# Patient Record
Sex: Female | Born: 1960 | Hispanic: No | Marital: Single | Smoking: Former smoker
Health system: Southern US, Community
[De-identification: ages and names within clinical notes are randomized; demographics above are authoritative.]

## PROBLEM LIST (undated history)

## (undated) DIAGNOSIS — Z803 Family history of malignant neoplasm of breast: Secondary | ICD-10-CM

## (undated) HISTORY — DX: Family history of malignant neoplasm of breast: Z80.3

## (undated) HISTORY — PX: BREAST BIOPSY: SHX20

## (undated) MED FILL — Dexamethasone Sodium Phosphate Inj 100 MG/10ML: INTRAMUSCULAR | Qty: 1 | Status: AC

---

## 2008-05-10 ENCOUNTER — Encounter: Payer: Self-pay | Admitting: Pulmonary Disease

## 2008-05-11 ENCOUNTER — Encounter: Payer: Self-pay | Admitting: Pulmonary Disease

## 2008-05-17 DIAGNOSIS — J479 Bronchiectasis, uncomplicated: Secondary | ICD-10-CM | POA: Insufficient documentation

## 2008-05-18 ENCOUNTER — Ambulatory Visit: Payer: Self-pay | Admitting: Pulmonary Disease

## 2008-05-18 DIAGNOSIS — K219 Gastro-esophageal reflux disease without esophagitis: Secondary | ICD-10-CM

## 2008-05-18 DIAGNOSIS — J45909 Unspecified asthma, uncomplicated: Secondary | ICD-10-CM | POA: Insufficient documentation

## 2008-05-18 DIAGNOSIS — Z8679 Personal history of other diseases of the circulatory system: Secondary | ICD-10-CM | POA: Insufficient documentation

## 2008-05-18 DIAGNOSIS — J449 Chronic obstructive pulmonary disease, unspecified: Secondary | ICD-10-CM | POA: Insufficient documentation

## 2008-05-18 DIAGNOSIS — Z8541 Personal history of malignant neoplasm of cervix uteri: Secondary | ICD-10-CM | POA: Insufficient documentation

## 2008-05-18 DIAGNOSIS — R079 Chest pain, unspecified: Secondary | ICD-10-CM

## 2008-05-18 DIAGNOSIS — E785 Hyperlipidemia, unspecified: Secondary | ICD-10-CM

## 2008-05-18 DIAGNOSIS — F17200 Nicotine dependence, unspecified, uncomplicated: Secondary | ICD-10-CM | POA: Insufficient documentation

## 2008-05-18 DIAGNOSIS — J4489 Other specified chronic obstructive pulmonary disease: Secondary | ICD-10-CM | POA: Insufficient documentation

## 2017-10-23 DIAGNOSIS — M5136 Other intervertebral disc degeneration, lumbar region: Secondary | ICD-10-CM | POA: Insufficient documentation

## 2017-10-23 DIAGNOSIS — M5416 Radiculopathy, lumbar region: Secondary | ICD-10-CM | POA: Insufficient documentation

## 2018-06-04 DIAGNOSIS — M50121 Cervical disc disorder at C4-C5 level with radiculopathy: Secondary | ICD-10-CM | POA: Insufficient documentation

## 2020-04-01 LAB — HEPATIC FUNCTION PANEL
ALT: 26 (ref 7–35)
AST: 33 (ref 13–35)
Alkaline Phosphatase: 102 (ref 25–125)
Bilirubin, Total: 0.5

## 2020-04-01 LAB — BASIC METABOLIC PANEL
BUN: 12 (ref 4–21)
CO2: 27 — AB (ref 13–22)
Chloride: 103 (ref 99–108)
Creatinine: 0.7 (ref 0.5–1.1)
Glucose: 152
Potassium: 3.5 (ref 3.4–5.3)
Sodium: 140 (ref 137–147)

## 2020-04-01 LAB — CBC: RBC: 4.46 (ref 3.87–5.11)

## 2020-04-01 LAB — IRON,TIBC AND FERRITIN PANEL
%SAT: 48.4
Ferritin: 579
Iron: 122
TIBC: 252

## 2020-04-01 LAB — CBC AND DIFFERENTIAL
HCT: 44 (ref 36–46)
Hemoglobin: 14.8 (ref 12.0–16.0)
Platelets: 218 (ref 150–399)
WBC: 8

## 2020-04-01 LAB — COMPREHENSIVE METABOLIC PANEL
Albumin: 4.3 (ref 3.5–5.0)
Calcium: 9.2 (ref 8.7–10.7)

## 2020-04-07 ENCOUNTER — Encounter: Payer: Self-pay | Admitting: Pharmacist

## 2020-04-07 ENCOUNTER — Other Ambulatory Visit: Payer: Self-pay | Admitting: Hematology and Oncology

## 2020-04-08 ENCOUNTER — Other Ambulatory Visit: Payer: Self-pay

## 2020-04-08 ENCOUNTER — Inpatient Hospital Stay: Payer: BC Managed Care – PPO | Attending: Oncology

## 2020-04-08 ENCOUNTER — Other Ambulatory Visit: Payer: Self-pay | Admitting: Hematology and Oncology

## 2020-04-08 NOTE — Progress Notes (Unsigned)
Patient in for phlebotomy and 2 attempts made for phlebotomy and both clotted off before blood reached the bag.Patient discharged with appt next Friday 04/15/2020 at 2 pm. Patient stable at discharge.

## 2020-04-15 ENCOUNTER — Inpatient Hospital Stay: Payer: BC Managed Care – PPO

## 2020-04-15 NOTE — Patient Instructions (Signed)

## 2020-04-15 NOTE — Progress Notes (Signed)
Kathleen Sherman presents today for phlebotomy per MD orders. Phlebotomy procedure started at 1420 and ended at 1431 502 grams removed. Patient observed for 20 minutes after procedure without any incident. Patient tolerated procedure well. IV needle removed intact.

## 2020-05-16 ENCOUNTER — Other Ambulatory Visit: Payer: Self-pay

## 2020-05-16 ENCOUNTER — Inpatient Hospital Stay: Payer: BC Managed Care – PPO | Attending: Oncology

## 2020-05-16 NOTE — Patient Instructions (Signed)

## 2020-05-16 NOTE — Progress Notes (Signed)
Kathleen Sherman presents today for phlebotomy per MD orders. Phlebotomy procedure started [id number] and ended at 1430. 485  grams removed. Patient observed for 30 minutes after procedure without any incident. Patient tolerated procedure well. IV needle removed intact.

## 2020-06-10 ENCOUNTER — Other Ambulatory Visit: Payer: Self-pay | Admitting: Hematology and Oncology

## 2020-06-10 NOTE — Progress Notes (Signed)
PT STABLE AT TIME OF DISCHARGE 

## 2020-06-14 NOTE — Progress Notes (Signed)
Called to speak with patient, she is listed as self-pay in epic. She states she now has [employer] Medicare. I let Damaris in [place] and Butch Penny in [place] now about change.

## 2020-06-15 ENCOUNTER — Inpatient Hospital Stay: Payer: Medicare Other | Attending: Oncology

## 2020-06-15 ENCOUNTER — Other Ambulatory Visit: Payer: Self-pay

## 2020-06-15 NOTE — Patient Instructions (Signed)

## 2020-06-15 NOTE — Progress Notes (Signed)
PT STABLE AT TIME OF DISCHARGE 

## 2020-06-15 NOTE — Progress Notes (Signed)
Kathleen Sherman presents today for phlebotomy per MD orders. Phlebotomy procedure started at 1415 and ended at 1425. 491 grams removed. Patient observed for 30 minutes after procedure without any incident. Patient tolerated procedure well. IV needle removed intact.

## 2020-06-16 DIAGNOSIS — R928 Other abnormal and inconclusive findings on diagnostic imaging of breast: Secondary | ICD-10-CM | POA: Diagnosis not present

## 2020-06-16 DIAGNOSIS — N6489 Other specified disorders of breast: Secondary | ICD-10-CM | POA: Diagnosis not present

## 2020-07-03 ENCOUNTER — Other Ambulatory Visit: Payer: Self-pay | Admitting: Oncology

## 2020-07-03 NOTE — Progress Notes (Signed)
[facility]  [address] [place],  [place]  [address] [phone]  Clinic Day:  07/04/2020  Referring physician: Ernestene Kiel, MD   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  Although she is only a carrier, she has the most aggressive strain of hemochromatosis mutation, which was likely behind her elevated iron parameters at her initial visit.  This led to her being phlebotomized over these past few months.  She comes in today to reassess her iron parameters.  Since her last visit, the patient has been doing well.  She denies having any significant myalgias/arthralgias which concern her for complications related to her underlying hemochromatosis.    PHYSICAL EXAM:  Blood pressure (!) 156/88, pulse 92, temperature 97.8 F (36.6 C), resp. rate 16, height 5\' 2"  (1.575 m), weight 206 lb 1.6 oz (93.5 kg), SpO2 95 %. Wt Readings from Last 3 Encounters:  07/04/20 206 lb 1.6 oz (93.5 kg)  04/01/20 206 lb 8 oz (93.7 kg)  04/18/20 206 lb 8 oz (93.7 kg)   Body mass index is 37.7 kg/m. Performance status (ECOG): 0 Physical Exam Constitutional:      Appearance: Normal appearance. She is not ill-appearing.  HENT:     Mouth/Throat:     Mouth: Mucous membranes are moist.     Pharynx: Oropharynx is clear. No oropharyngeal exudate or posterior oropharyngeal erythema.  Cardiovascular:     Rate and Rhythm: Normal rate and regular rhythm.     Heart sounds: No murmur heard. No friction rub. No gallop.   Pulmonary:     Effort: Pulmonary effort is normal. No respiratory distress.     Breath sounds: Normal breath sounds. No wheezing, rhonchi or rales.  Chest:  Breasts:     Right: No axillary adenopathy or supraclavicular adenopathy.     Left: No axillary adenopathy or supraclavicular adenopathy.    Abdominal:     General: Bowel sounds are normal. There is no distension.     Palpations: Abdomen  is soft. There is no mass.     Tenderness: There is no abdominal tenderness.  Musculoskeletal:        General: No swelling.     Right lower leg: No edema.     Left lower leg: No edema.  Lymphadenopathy:     Cervical: No cervical adenopathy.     Upper Body:     Right upper body: No supraclavicular or axillary adenopathy.     Left upper body: No supraclavicular or axillary adenopathy.     Lower Body: No right inguinal adenopathy. No left inguinal adenopathy.  Skin:    General: Skin is warm.     Coloration: Skin is not jaundiced.     Findings: No lesion or rash.  Neurological:     General: No focal deficit present.     Mental Status: She is alert and oriented to person, place, and time. Mental status is at baseline.     Cranial Nerves: Cranial nerves are intact.  Psychiatric:        Mood and Affect: Mood normal.        Behavior: Behavior normal.        Thought Content: Thought content normal.     LABS:     ASSESSMENT & PLAN:  Assessment/Plan:  A 60 y.o. 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  Her iron parameters are lower today than previously, which reflects how effective her monthly phlebotomies  were in bringing down getting her baseline iron stores.  Her ferritin remains elevated.  Based upon this, I will arrange for her to be phlebotomized within the forthcoming week.  However, as she is only a carrier for hemochromatosis, no additional phlebotomies will be scheduled for now.   I will see her back in 6 months for repeat clinical assessment. The patient understands all the plans discussed today and is in agreement with them.    Onaje Warne Kirby Funk, MD

## 2020-07-04 ENCOUNTER — Other Ambulatory Visit: Payer: Self-pay | Admitting: Oncology

## 2020-07-04 ENCOUNTER — Telehealth: Payer: Self-pay | Admitting: Oncology

## 2020-07-04 ENCOUNTER — Inpatient Hospital Stay: Payer: Medicare Other

## 2020-07-04 ENCOUNTER — Encounter: Payer: Self-pay | Admitting: Oncology

## 2020-07-04 ENCOUNTER — Other Ambulatory Visit: Payer: Self-pay | Admitting: Hematology and Oncology

## 2020-07-04 ENCOUNTER — Inpatient Hospital Stay: Payer: Medicare Other | Attending: Oncology | Admitting: Oncology

## 2020-07-04 ENCOUNTER — Other Ambulatory Visit: Payer: Self-pay

## 2020-07-04 ENCOUNTER — Telehealth: Payer: Self-pay

## 2020-07-04 DIAGNOSIS — Z0001 Encounter for general adult medical examination with abnormal findings: Secondary | ICD-10-CM | POA: Diagnosis not present

## 2020-07-04 DIAGNOSIS — D649 Anemia, unspecified: Secondary | ICD-10-CM | POA: Diagnosis not present

## 2020-07-04 LAB — HEPATIC FUNCTION PANEL
ALT: 24 (ref 7–35)
AST: 25 (ref 13–35)
Alkaline Phosphatase: 98 (ref 25–125)
Bilirubin, Total: 0.4

## 2020-07-04 LAB — BASIC METABOLIC PANEL
BUN: 10 (ref 4–21)
CO2: 29 — AB (ref 13–22)
Chloride: 102 (ref 99–108)
Creatinine: 0.8 (ref 0.5–1.1)
Glucose: 99
Potassium: 3.9 (ref 3.4–5.3)
Sodium: 138 (ref 137–147)

## 2020-07-04 LAB — IRON,TIBC AND FERRITIN PANEL
%SAT: 27
Ferritin: 314
Iron: 71
TIBC: 262

## 2020-07-04 LAB — COMPREHENSIVE METABOLIC PANEL
Albumin: 4.1 (ref 3.5–5.0)
Calcium: 9.4 (ref 8.7–10.7)

## 2020-07-04 LAB — CBC AND DIFFERENTIAL
HCT: 41 (ref 36–46)
Hemoglobin: 14.1 (ref 12.0–16.0)
Neutrophils Absolute: 4.06
Platelets: 218 (ref 150–399)
WBC: 7

## 2020-07-04 LAB — CBC: RBC: 4.19 (ref 3.87–5.11)

## 2020-07-04 NOTE — Telephone Encounter (Signed)
Dr Melvyn Neth recommends pt to have 1 phlebotomy, as ferritin is still 314. He will see her in 6 months. I told pt that schedulers will be calling her with appt. Pt verbalized understanding.

## 2020-07-04 NOTE — Telephone Encounter (Signed)
Per 1/3 LOS, patient scheduled for July Appt's.  Gave patient Appt Summary

## 2020-07-05 ENCOUNTER — Other Ambulatory Visit: Payer: Self-pay | Admitting: Oncology

## 2020-07-06 ENCOUNTER — Telehealth: Payer: Self-pay | Admitting: Oncology

## 2020-07-06 NOTE — Telephone Encounter (Signed)
07/06/20 spoke with patient about next appt

## 2020-07-07 ENCOUNTER — Inpatient Hospital Stay: Payer: Medicare Other

## 2020-07-07 ENCOUNTER — Other Ambulatory Visit: Payer: Self-pay

## 2020-07-07 NOTE — Progress Notes (Signed)
Kathleen Sherman presents today for phlebotomy per MD orders. Phlebotomy procedure started at 1533 and ended at 1544, 491 grams removed. Patient observed for 30 minutes after procedure without any incident. Patient tolerated procedure well. IV needle removed intact.

## 2020-07-07 NOTE — Patient Instructions (Signed)
Therapeutic Phlebotomy Therapeutic phlebotomy is the planned removal of blood from a person's body for the purpose of treating a medical condition. The procedure is similar to donating blood. Usually, about a pint (470 mL, or 0.47 L) of blood is removed. The average adult has 9-12 pints (4.3-5.7 L) of blood in the body. Therapeutic phlebotomy may be used to treat the following medical conditions:  Hemochromatosis. This is a condition in which the blood contains too much iron.  Polycythemia vera. This is a condition in which the blood contains too many red blood cells.  Porphyria cutanea tarda. This is a disease in which an important part of hemoglobin is not made properly. It results in the buildup of abnormal amounts of porphyrins in the body.  Sickle cell disease. This is a condition in which the red blood cells form an abnormal crescent shape rather than a round shape. Tell a health care provider about:  Any allergies you have.  All medicines you are taking, including vitamins, herbs, eye drops, creams, and over-the-counter medicines.  Any problems you or family members have had with anesthetic medicines.  Any blood disorders you have.  Any surgeries you have had.  Any medical conditions you have.  Whether you are pregnant or may be pregnant. What are the risks? Generally, this is a safe procedure. However, problems may occur, including:  Nausea or light-headedness.  Low blood pressure (hypotension).  Soreness, bleeding, swelling, or bruising at the needle insertion site.  Infection. What happens before the procedure?  Follow instructions from your health care provider about eating or drinking restrictions.  Ask your health care provider about: ? Changing or stopping your regular medicines. This is especially important if you are taking diabetes medicines or blood thinners (anticoagulants). ? Taking medicines such as aspirin and ibuprofen. These medicines can thin your  blood. Do not take these medicines unless your health care provider tells you to take them. ? Taking over-the-counter medicines, vitamins, herbs, and supplements.  Wear clothing with sleeves that can be raised above the elbow.  Plan to have someone take you home from the hospital or clinic.  You may have a blood sample taken.  Your blood pressure, pulse rate, and breathing rate will be measured. What happens during the procedure?   To lower your risk of infection: ? Your health care team will wash or sanitize their hands. ? Your skin will be cleaned with an antiseptic.  You may be given a medicine to numb the area (local anesthetic).  A tourniquet will be placed on your arm.  A needle will be inserted into one of your veins.  Tubing and a collection bag will be attached to that needle.  Blood will flow through the needle and tubing into the collection bag.  The collection bag will be placed lower than your arm to allow gravity to help the flow of blood into the bag.  You may be asked to open and close your hand slowly and continually during the entire collection.  After the specified amount of blood has been removed from your body, the collection bag and tubing will be clamped.  The needle will be removed from your vein.  Pressure will be held on the site of the needle insertion to stop the bleeding.  A bandage (dressing) will be placed over the needle insertion site. The procedure may vary among health care providers and hospitals. What happens after the procedure?  Your blood pressure, pulse rate, and breathing rate will be   measured after the procedure.  You will be encouraged to drink fluids.  Your recovery will be assessed and monitored.  You can return to your normal activities as told by your health care provider. Summary  Therapeutic phlebotomy is the planned removal of blood from a person's body for the purpose of treating a medical condition.  Therapeutic  phlebotomy may be used to treat hemochromatosis, polycythemia vera, porphyria cutanea tarda, or sickle cell disease.  In the procedure, a needle is inserted and about a pint (470 mL, or 0.47 L) of blood is removed. The average adult has 9-12 pints (4.3-5.7 L) of blood in the body.  This is generally a safe procedure, but it can sometimes cause problems such as nausea, light-headedness, or low blood pressure (hypotension). This information is not intended to replace advice given to you by your health care provider. Make sure you discuss any questions you have with your health care provider. Document Revised: 07/04/2017 Document Reviewed: 07/04/2017 Elsevier Patient Education  2020 Elsevier Inc. Therapeutic Phlebotomy Discharge Instructions  - Increase your fluid intake over the next 4 hours  - No smoking for 30 minutes  - Avoid using the affected arm (the one you had the blood drawn from) for heavy lifting or other activities.  - You may resume all normal activities after 30 minutes.  You are to notify the office if you experience:   - Persistent dizziness and/or lightheadedness -Uncontrolled or excessive bleeding at the site.

## 2020-09-02 DIAGNOSIS — M65332 Trigger finger, left middle finger: Secondary | ICD-10-CM | POA: Diagnosis not present

## 2020-09-02 DIAGNOSIS — G5602 Carpal tunnel syndrome, left upper limb: Secondary | ICD-10-CM | POA: Diagnosis not present

## 2020-09-16 DIAGNOSIS — J300 Vasomotor rhinitis: Secondary | ICD-10-CM | POA: Diagnosis not present

## 2020-09-16 DIAGNOSIS — R0602 Shortness of breath: Secondary | ICD-10-CM | POA: Diagnosis not present

## 2020-09-16 DIAGNOSIS — Z0000 Encounter for general adult medical examination without abnormal findings: Secondary | ICD-10-CM | POA: Diagnosis not present

## 2020-09-16 DIAGNOSIS — E785 Hyperlipidemia, unspecified: Secondary | ICD-10-CM | POA: Diagnosis not present

## 2020-09-16 DIAGNOSIS — Z1211 Encounter for screening for malignant neoplasm of colon: Secondary | ICD-10-CM | POA: Diagnosis not present

## 2020-09-16 DIAGNOSIS — R079 Chest pain, unspecified: Secondary | ICD-10-CM | POA: Diagnosis not present

## 2020-11-04 DIAGNOSIS — J300 Vasomotor rhinitis: Secondary | ICD-10-CM | POA: Diagnosis not present

## 2020-11-04 DIAGNOSIS — E785 Hyperlipidemia, unspecified: Secondary | ICD-10-CM | POA: Diagnosis not present

## 2020-11-04 DIAGNOSIS — G5603 Carpal tunnel syndrome, bilateral upper limbs: Secondary | ICD-10-CM | POA: Diagnosis not present

## 2020-11-11 DIAGNOSIS — Z4789 Encounter for other orthopedic aftercare: Secondary | ICD-10-CM | POA: Diagnosis not present

## 2020-11-11 DIAGNOSIS — M542 Cervicalgia: Secondary | ICD-10-CM | POA: Diagnosis not present

## 2020-12-05 DIAGNOSIS — Z4789 Encounter for other orthopedic aftercare: Secondary | ICD-10-CM | POA: Diagnosis not present

## 2020-12-05 DIAGNOSIS — M25512 Pain in left shoulder: Secondary | ICD-10-CM | POA: Diagnosis not present

## 2020-12-05 DIAGNOSIS — M542 Cervicalgia: Secondary | ICD-10-CM | POA: Diagnosis not present

## 2020-12-16 DIAGNOSIS — Z4789 Encounter for other orthopedic aftercare: Secondary | ICD-10-CM | POA: Diagnosis not present

## 2020-12-16 DIAGNOSIS — R7309 Other abnormal glucose: Secondary | ICD-10-CM | POA: Diagnosis not present

## 2020-12-16 DIAGNOSIS — E785 Hyperlipidemia, unspecified: Secondary | ICD-10-CM | POA: Diagnosis not present

## 2020-12-16 DIAGNOSIS — M25512 Pain in left shoulder: Secondary | ICD-10-CM | POA: Diagnosis not present

## 2020-12-16 DIAGNOSIS — M542 Cervicalgia: Secondary | ICD-10-CM | POA: Diagnosis not present

## 2020-12-16 DIAGNOSIS — Z79899 Other long term (current) drug therapy: Secondary | ICD-10-CM | POA: Diagnosis not present

## 2020-12-27 NOTE — Progress Notes (Signed)
[facility]  [address] [place],  [place]  [address] [phone]  Clinic Day:  01/03/2021  Referring physician: Ernestene Kiel, MD  This document serves as a record of services personally performed by Marice Potter, MD. It was created on their behalf by [facility], a trained medical scribe. The creation of this record is based on the scribe's personal observations and the provider's statements to them.  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  Although she is only a carrier, she has the most aggressive strain of hemochromatosis mutation, which led to her last phlebotomy being done 6 months ago.  She comes in today to reassess her iron parameters.  Since her last visit, the patient has been doing well.  She denies having any significant myalgias/arthralgias which concern her for complications related to her underlying hemochromatosis.    PHYSICAL EXAM:  Blood pressure (!) 145/85, pulse (!) 102, temperature 98.3 F (36.8 C), resp. rate 16, height 5\' 2"  (1.575 m), weight 203 lb 8 oz (92.3 kg), SpO2 95 %. Wt Readings from Last 3 Encounters:  01/03/21 203 lb 8 oz (92.3 kg)  07/07/20 208 lb (94.3 kg)  07/04/20 206 lb 1.6 oz (93.5 kg)   Body mass index is 37.22 kg/m. Performance status (ECOG): 0 Physical Exam Constitutional:      Appearance: Normal appearance. She is not ill-appearing.  HENT:     Mouth/Throat:     Mouth: Mucous membranes are moist.     Pharynx: Oropharynx is clear. No oropharyngeal exudate or posterior oropharyngeal erythema.  Cardiovascular:     Rate and Rhythm: Normal rate and regular rhythm.     Heart sounds: No murmur heard.   No friction rub. No gallop.  Pulmonary:     Effort: Pulmonary effort is normal. No respiratory distress.     Breath sounds: Normal breath sounds. No wheezing, rhonchi or rales.  Chest:  Breasts:    Right: No axillary adenopathy  or supraclavicular adenopathy.     Left: No axillary adenopathy or supraclavicular adenopathy.  Abdominal:     General: Bowel sounds are normal. There is no distension.     Palpations: Abdomen is soft. There is no mass.     Tenderness: There is no abdominal tenderness.  Musculoskeletal:        General: No swelling.     Right lower leg: No edema.     Left lower leg: No edema.  Lymphadenopathy:     Cervical: No cervical adenopathy.     Upper Body:     Right upper body: No supraclavicular or axillary adenopathy.     Left upper body: No supraclavicular or axillary adenopathy.     Lower Body: No right inguinal adenopathy. No left inguinal adenopathy.  Skin:    General: Skin is warm.     Coloration: Skin is not jaundiced.     Findings: No lesion or rash.  Neurological:     General: No focal deficit present.     Mental Status: She is alert and oriented to person, place, and time. Mental status is at baseline.     Cranial Nerves: Cranial nerves are intact.  Psychiatric:        Mood and Affect: Mood normal.        Behavior: Behavior normal.        Thought Content: Thought content normal.    LABS:    Ref. Range 12/30/2020  14:50  Iron Latest Ref Range: 28 - 170 ug/dL 99  UIBC Latest Units: ug/dL 201  TIBC Latest Ref Range: 250 - 450 ug/dL 300  Saturation Ratios Latest Ref Range: 10.4 - 31.8 % 33 (H)  Ferritin Latest Ref Range: 11 - 307 ng/mL 249    Ref. Range 12/30/2020 00:00  Sodium Latest Ref Range: 137 - 147  137  Potassium Latest Ref Range: 3.4 - 5.3  3.9  Chloride Latest Ref Range: 99 - 108  99  CO2 Latest Ref Range: 13 - 22  31 (A)  Glucose Unknown 103  BUN Latest Ref Range: 4 - 21  7  Creatinine Latest Ref Range: 0.5 - 1.1  0.6  Calcium Latest Ref Range: 8.7 - 10.7  9.1  Alkaline Phosphatase Latest Ref Range: 25 - 125  101  Albumin Latest Ref Range: 3.5 - 5.0  4.3  AST Latest Ref Range: 13 - 35  27  ALT Latest Ref Range: 7 - 35  21  Bilirubin, Total Unknown 0.8  WBC  Unknown 6.5  RBC Latest Ref Range: 3.87 - 5.11  4.6  Hemoglobin Latest Ref Range: 12.0 - 16.0  14.7  HCT Latest Ref Range: 36 - 46  44  Platelets Latest Ref Range: 150 - 399  212  NEUT# Unknown 4.03   ASSESSMENT & PLAN:  Assessment/Plan:  A 60 y.o. 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  I am pleased as her ferritin has fallen back to a normal level.  As she is only a carrier for hemochromatosis, no additional phlebotomies will be scheduled for now.   I will see her back in 6 months to reassess her iron parameters.  The patient understands all the plans discussed today and is in agreement with them.     I, Rita Ohara, am acting as scribe for Marice Potter, MD    I have reviewed this report as typed by the medical scribe, and it is complete and accurate.  Dequincy Macarthur Critchley, MD

## 2020-12-30 ENCOUNTER — Other Ambulatory Visit: Payer: Self-pay

## 2020-12-30 ENCOUNTER — Encounter: Payer: Self-pay | Admitting: Hematology and Oncology

## 2020-12-30 ENCOUNTER — Inpatient Hospital Stay: Payer: Medicare Other | Attending: Oncology

## 2020-12-30 DIAGNOSIS — D649 Anemia, unspecified: Secondary | ICD-10-CM | POA: Diagnosis not present

## 2020-12-30 LAB — CBC AND DIFFERENTIAL
HCT: 44 (ref 36–46)
Hemoglobin: 14.7 (ref 12.0–16.0)
Neutrophils Absolute: 4.03
Platelets: 212 (ref 150–399)
WBC: 6.5

## 2020-12-30 LAB — BASIC METABOLIC PANEL
BUN: 7 (ref 4–21)
CO2: 31 — AB (ref 13–22)
Chloride: 99 (ref 99–108)
Creatinine: 0.6 (ref 0.5–1.1)
Glucose: 103
Potassium: 3.9 (ref 3.4–5.3)
Sodium: 137 (ref 137–147)

## 2020-12-30 LAB — IRON AND TIBC
Iron: 99 ug/dL (ref 28–170)
Saturation Ratios: 33 % — ABNORMAL HIGH (ref 10.4–31.8)
TIBC: 300 ug/dL (ref 250–450)
UIBC: 201 ug/dL

## 2020-12-30 LAB — COMPREHENSIVE METABOLIC PANEL
Albumin: 4.3 (ref 3.5–5.0)
Calcium: 9.1 (ref 8.7–10.7)

## 2020-12-30 LAB — HEPATIC FUNCTION PANEL
ALT: 21 (ref 7–35)
AST: 27 (ref 13–35)
Alkaline Phosphatase: 101 (ref 25–125)
Bilirubin, Total: 0.8

## 2020-12-30 LAB — CBC: RBC: 4.6 (ref 3.87–5.11)

## 2020-12-30 LAB — FERRITIN: Ferritin: 249 ng/mL (ref 11–307)

## 2021-01-03 ENCOUNTER — Inpatient Hospital Stay (INDEPENDENT_AMBULATORY_CARE_PROVIDER_SITE_OTHER): Payer: Medicare Other | Admitting: Oncology

## 2021-01-03 ENCOUNTER — Other Ambulatory Visit: Payer: Self-pay | Admitting: Oncology

## 2021-01-03 ENCOUNTER — Other Ambulatory Visit: Payer: Self-pay

## 2021-01-27 DIAGNOSIS — K5730 Diverticulosis of large intestine without perforation or abscess without bleeding: Secondary | ICD-10-CM | POA: Diagnosis not present

## 2021-01-27 DIAGNOSIS — Z1211 Encounter for screening for malignant neoplasm of colon: Secondary | ICD-10-CM | POA: Diagnosis not present

## 2021-01-27 DIAGNOSIS — D123 Benign neoplasm of transverse colon: Secondary | ICD-10-CM | POA: Diagnosis not present

## 2021-01-27 DIAGNOSIS — K635 Polyp of colon: Secondary | ICD-10-CM | POA: Diagnosis not present

## 2021-02-01 DIAGNOSIS — R922 Inconclusive mammogram: Secondary | ICD-10-CM | POA: Diagnosis not present

## 2021-02-01 DIAGNOSIS — R928 Other abnormal and inconclusive findings on diagnostic imaging of breast: Secondary | ICD-10-CM | POA: Diagnosis not present

## 2021-02-01 DIAGNOSIS — N6311 Unspecified lump in the right breast, upper outer quadrant: Secondary | ICD-10-CM | POA: Diagnosis not present

## 2021-02-06 DIAGNOSIS — C50511 Malignant neoplasm of lower-outer quadrant of right female breast: Secondary | ICD-10-CM | POA: Diagnosis not present

## 2021-02-06 DIAGNOSIS — C50411 Malignant neoplasm of upper-outer quadrant of right female breast: Secondary | ICD-10-CM | POA: Diagnosis not present

## 2021-02-15 DIAGNOSIS — C50411 Malignant neoplasm of upper-outer quadrant of right female breast: Secondary | ICD-10-CM | POA: Diagnosis not present

## 2021-02-15 DIAGNOSIS — Z170 Estrogen receptor positive status [ER+]: Secondary | ICD-10-CM | POA: Diagnosis not present

## 2021-02-16 ENCOUNTER — Telehealth: Payer: Self-pay | Admitting: Genetic Counselor

## 2021-02-16 ENCOUNTER — Other Ambulatory Visit: Payer: Self-pay | Admitting: Surgery

## 2021-02-16 DIAGNOSIS — C50411 Malignant neoplasm of upper-outer quadrant of right female breast: Secondary | ICD-10-CM

## 2021-02-16 DIAGNOSIS — Z170 Estrogen receptor positive status [ER+]: Secondary | ICD-10-CM

## 2021-02-16 NOTE — Telephone Encounter (Signed)
Received a call from [place]. [place] to schedule a genetic counseling appt w/Karen on 8/31 at 2pm. Pt aware to arrive 15 minutes early.

## 2021-03-01 ENCOUNTER — Inpatient Hospital Stay: Payer: Medicare Other | Attending: Oncology | Admitting: Genetic Counselor

## 2021-03-01 ENCOUNTER — Encounter: Payer: Self-pay | Admitting: Genetic Counselor

## 2021-03-01 ENCOUNTER — Ambulatory Visit
Admission: RE | Admit: 2021-03-01 | Discharge: 2021-03-01 | Disposition: A | Discharge status: Home / Self Care | Payer: Medicare Other | Source: Ambulatory Visit | Attending: Surgery | Admitting: Surgery

## 2021-03-01 ENCOUNTER — Inpatient Hospital Stay: Payer: Medicare Other

## 2021-03-01 ENCOUNTER — Other Ambulatory Visit: Payer: Self-pay

## 2021-03-01 DIAGNOSIS — Z803 Family history of malignant neoplasm of breast: Secondary | ICD-10-CM | POA: Diagnosis not present

## 2021-03-01 DIAGNOSIS — C50411 Malignant neoplasm of upper-outer quadrant of right female breast: Secondary | ICD-10-CM | POA: Diagnosis not present

## 2021-03-01 DIAGNOSIS — Z170 Estrogen receptor positive status [ER+]: Secondary | ICD-10-CM | POA: Diagnosis not present

## 2021-03-01 DIAGNOSIS — D0511 Intraductal carcinoma in situ of right breast: Secondary | ICD-10-CM | POA: Diagnosis not present

## 2021-03-01 MED ORDER — GADOBUTROL 1 MMOL/ML IV SOLN
10.0000 mL | Freq: Once | INTRAVENOUS | Status: AC | PRN
  Administered 2021-03-01: 10 mL via INTRAVENOUS

## 2021-03-03 ENCOUNTER — Encounter: Payer: Self-pay | Admitting: Genetic Counselor

## 2021-03-03 ENCOUNTER — Encounter: Payer: Self-pay | Admitting: Oncology

## 2021-03-03 DIAGNOSIS — C50411 Malignant neoplasm of upper-outer quadrant of right female breast: Secondary | ICD-10-CM | POA: Insufficient documentation

## 2021-03-03 DIAGNOSIS — Z803 Family history of malignant neoplasm of breast: Secondary | ICD-10-CM | POA: Insufficient documentation

## 2021-03-03 NOTE — Progress Notes (Signed)
REFERRING PROVIDER: Pollyann Samples, MD [place]. Kathleen Sherman,  [place] [address]  PRIMARY PROVIDER:  Ernestene Kiel, MD  PRIMARY REASON FOR VISIT:  1. Family history of breast cancer   2. Malignant neoplasm of upper-outer quadrant of right breast in female, estrogen receptor positive ([place])      HISTORY OF PRESENT ILLNESS:   Kathleen Sherman, a 60 y.o. female, was seen for a  cancer genetics consultation at the request of Dr. Lilia Pro due to a personal and family history of breast cancer.  Kathleen Sherman presents to clinic today to discuss the possibility of a hereditary predisposition to cancer, genetic testing, and to further clarify her future cancer risks, as well as potential cancer risks for family members.   In August 2022, at the age of 8, Kathleen Sherman was diagnosed with cancer of the right breast.   CANCER HISTORY:  Oncology History   No history exists.     RISK FACTORS:  Menarche was at age 62.  First live birth at age 38.   Ovaries intact: Yes. Hysterectomy: no.  Menopausal status: postmenopausal.  HRT use: 0 years. Colonoscopy: yes;  2-5 polyps . Mammogram within the last year: yes. Number of breast biopsies: 1. Up to date with pelvic exams: yes. Any excessive radiation exposure in the past: no  Past Medical History:  Diagnosis Date   Family history of breast cancer     Social History   Socioeconomic History   Marital status: Single    Spouse name: Not on file   Number of children: Not on file   Years of education: Not on file   Highest education level: Not on file  Occupational History   Not on file  Tobacco Use   Smoking status: Former   Smokeless tobacco: Never  Substance and Sexual Activity   Alcohol use: Not on file   Drug use: Not on file   Sexual activity: Not on file  Other Topics Concern   Not on file  Social History Narrative   Not on file   Social Determinants of Health   Financial Resource Strain: Not on file  Food  Insecurity: Not on file  Transportation Needs: Not on file  Physical Activity: Not on file  Stress: Not on file  Social Connections: Not on file     FAMILY HISTORY:  We obtained a detailed, 4-generation family history.  Significant diagnoses are listed below: Family History  Problem Relation Age of Onset   Breast cancer Mother 56   Cirrhosis Father    Breast cancer Sister 79   Lung cancer Maternal Aunt    Stomach cancer Paternal Uncle    Stomach cancer Paternal Uncle    Leukemia Maternal Grandmother     The patient has one son who is cancer free.  She has two sisters and five brothers.  One sister had breast cancer at 37.   The patient's mother had breast cancer at 40.  She has several siblings, two sisters had lung cancer.  Her mother had leukemia.  The patient's father died of liver cancer.  He had five brothers and a sister.  Two brothers had stomach cancer.  Kathleen Sherman is unaware of previous family history of genetic testing for hereditary cancer risks. Patient's maternal ancestors are of [place] descent, and paternal ancestors are of [place] descent. There is no reported Ashkenazi Jewish ancestry. There is no known consanguinity.  GENETIC COUNSELING ASSESSMENT: Kathleen Sherman is a 60 y.o. female with  a personal and family history of breast cancer which is somewhat suggestive of a hereditary cancer syndrome and predisposition to cancer given the young age of onset in her sister and three people with breast cancer. We, therefore, discussed and recommended the following at today's visit.   DISCUSSION: We discussed that 5 - 10% of breast cancer is hereditary, with most cases associated with BRCA mutations.  There are other genes that can be associated with hereditary breast cancer syndromes.  These include ATM, CHEK2 and PALB2.  We discussed that testing is beneficial for several reasons including knowing how to follow individuals after completing their treatment, and understand if other  family members could be at risk for cancer and allow them to undergo genetic testing.   We reviewed the characteristics, features and inheritance patterns of hereditary cancer syndromes. We also discussed genetic testing, including the appropriate family members to test, the process of testing, insurance coverage and turn-around-time for results. We discussed the implications of a negative, positive, carrier and/or variant of uncertain significant result. We recommended Kathleen Sherman pursue genetic testing for the BRCAPlus gene panel. Once completed, we will reflex to a CancerNext-Expanded+RNAinsight panel.  The CancerNext-Expanded gene panel offered by [facility] and includes sequencing and rearrangement analysis for the following 77 genes: AIP, ALK, APC*, ATM*, AXIN2, BAP1, BARD1, BLM, BMPR1A, BRCA1*, BRCA2*, BRIP1*, CDC73, CDH1*, CDK4, CDKN1B, CDKN2A, CHEK2*, CTNNA1, DICER1, FANCC, FH, FLCN, GALNT12, KIF1B, LZTR1, MAX, MEN1, MET, MLH1*, MSH2*, MSH3, MSH6*, MUTYH*, NBN, NF1*, NF2, NTHL1, PALB2*, PHOX2B, PMS2*, POT1, PRKAR1A, PTCH1, PTEN*, RAD51C*, RAD51D*, RB1, RECQL, RET, SDHA, SDHAF2, SDHB, SDHC, SDHD, SMAD4, SMARCA4, SMARCB1, SMARCE1, STK11, SUFU, TMEM127, TP53*, TSC1, TSC2, VHL and XRCC2 (sequencing and deletion/duplication); EGFR, EGLN1, HOXB13, KIT, MITF, PDGFRA, POLD1, and POLE (sequencing only); EPCAM and GREM1 (deletion/duplication only). DNA and RNA analyses performed for * genes.   Based on Kathleen Sherman's personal and family history of cancer, she meets medical criteria for genetic testing. Despite that she meets criteria, she may still have an out of pocket cost. We discussed that if her out of pocket cost for testing is over $100, the laboratory will call and confirm whether she wants to proceed with testing.  If the out of pocket cost of testing is less than $100 she will be billed by the genetic testing laboratory.   PLAN: After considering the risks, benefits, and limitations, Kathleen Sherman  provided informed consent to pursue genetic testing and the blood sample was sent to [facility] for analysis of the CancerNext-Expanded_RNAinsight. Results should be available within approximately 2-3 weeks' time, at which point they will be disclosed by telephone to Kathleen Sherman, as will any additional recommendations warranted by these results. Kathleen Sherman will receive a summary of her genetic counseling visit and a copy of her results once available. This information will also be available in Epic.   Lastly, we encouraged Kathleen Sherman to remain in contact with cancer genetics annually so that we can continuously update the family history and inform her of any changes in cancer genetics and testing that may be of benefit for this family.   Kathleen Sherman questions were answered to her satisfaction today. Our contact information was provided should additional questions or concerns arise. Thank you for the referral and allowing [place] to share in the care of your patient.   Dariya Sherman P. Florene Sherman, [place], [facility] Licensed, Insurance risk surveyor [email or web address] phone: [phone]  The patient was seen for a total of 45 minutes in face-to-face genetic counseling.  The patient was seen alone.   This patient was discussed with Drs. Magrinat, Lindi Adie and/or Burr Medico who agrees with the above.    _______________________________________________________________________ For Office Staff:  Number of people involved in session: 1 Was an Intern/ student involved with case: yes Kathleen Sherman

## 2021-03-09 ENCOUNTER — Other Ambulatory Visit: Payer: Self-pay | Admitting: Surgery

## 2021-03-09 DIAGNOSIS — Z170 Estrogen receptor positive status [ER+]: Secondary | ICD-10-CM

## 2021-03-09 DIAGNOSIS — C50411 Malignant neoplasm of upper-outer quadrant of right female breast: Secondary | ICD-10-CM

## 2021-03-10 ENCOUNTER — Other Ambulatory Visit: Payer: Self-pay | Admitting: Surgery

## 2021-03-10 DIAGNOSIS — Z170 Estrogen receptor positive status [ER+]: Secondary | ICD-10-CM

## 2021-03-10 DIAGNOSIS — C50411 Malignant neoplasm of upper-outer quadrant of right female breast: Secondary | ICD-10-CM

## 2021-03-14 ENCOUNTER — Other Ambulatory Visit: Payer: Self-pay | Admitting: Genetic Counselor

## 2021-03-14 ENCOUNTER — Telehealth: Payer: Self-pay | Admitting: Genetic Counselor

## 2021-03-14 DIAGNOSIS — Z170 Estrogen receptor positive status [ER+]: Secondary | ICD-10-CM

## 2021-03-14 NOTE — Telephone Encounter (Signed)
Notified patient that sample arrived at the lab damaged and they need another sample.  We have set this up at [facility] for tomorrow 9/14 @ 2 PM.

## 2021-03-15 ENCOUNTER — Other Ambulatory Visit: Payer: Self-pay

## 2021-03-15 ENCOUNTER — Inpatient Hospital Stay: Payer: Medicare Other | Attending: Oncology

## 2021-03-15 ENCOUNTER — Other Ambulatory Visit: Payer: Medicare Other

## 2021-03-15 DIAGNOSIS — E1169 Type 2 diabetes mellitus with other specified complication: Secondary | ICD-10-CM | POA: Diagnosis not present

## 2021-03-15 DIAGNOSIS — Z803 Family history of malignant neoplasm of breast: Secondary | ICD-10-CM | POA: Diagnosis not present

## 2021-03-15 DIAGNOSIS — E785 Hyperlipidemia, unspecified: Secondary | ICD-10-CM | POA: Diagnosis not present

## 2021-03-15 DIAGNOSIS — C50411 Malignant neoplasm of upper-outer quadrant of right female breast: Secondary | ICD-10-CM | POA: Diagnosis not present

## 2021-03-15 DIAGNOSIS — J310 Chronic rhinitis: Secondary | ICD-10-CM | POA: Diagnosis not present

## 2021-03-15 DIAGNOSIS — G5603 Carpal tunnel syndrome, bilateral upper limbs: Secondary | ICD-10-CM | POA: Diagnosis not present

## 2021-03-20 ENCOUNTER — Ambulatory Visit
Admission: RE | Admit: 2021-03-20 | Discharge: 2021-03-20 | Disposition: A | Discharge status: Home / Self Care | Payer: Medicare Other | Source: Ambulatory Visit | Attending: Surgery | Admitting: Surgery

## 2021-03-20 ENCOUNTER — Other Ambulatory Visit: Payer: Self-pay

## 2021-03-20 ENCOUNTER — Other Ambulatory Visit: Payer: Self-pay | Admitting: Diagnostic Radiology

## 2021-03-20 ENCOUNTER — Encounter: Payer: Self-pay | Admitting: Oncology

## 2021-03-20 DIAGNOSIS — C50411 Malignant neoplasm of upper-outer quadrant of right female breast: Secondary | ICD-10-CM

## 2021-03-20 DIAGNOSIS — C50211 Malignant neoplasm of upper-inner quadrant of right female breast: Secondary | ICD-10-CM | POA: Diagnosis not present

## 2021-03-20 DIAGNOSIS — D0511 Intraductal carcinoma in situ of right breast: Secondary | ICD-10-CM | POA: Diagnosis not present

## 2021-03-20 DIAGNOSIS — Z170 Estrogen receptor positive status [ER+]: Secondary | ICD-10-CM

## 2021-03-20 HISTORY — PX: BREAST BIOPSY: SHX20

## 2021-03-20 MED ORDER — GADOBUTROL 1 MMOL/ML IV SOLN
10.0000 mL | Freq: Once | INTRAVENOUS | Status: AC | PRN
  Administered 2021-03-20: 10 mL via INTRAVENOUS

## 2021-03-24 ENCOUNTER — Other Ambulatory Visit: Payer: Self-pay

## 2021-03-24 ENCOUNTER — Ambulatory Visit
Admission: RE | Admit: 2021-03-24 | Discharge: 2021-03-24 | Disposition: A | Discharge status: Home / Self Care | Payer: Medicare Other | Source: Ambulatory Visit | Attending: Surgery | Admitting: Surgery

## 2021-03-24 ENCOUNTER — Other Ambulatory Visit (HOSPITAL_COMMUNITY): Payer: Self-pay | Admitting: Diagnostic Radiology

## 2021-03-24 DIAGNOSIS — Z170 Estrogen receptor positive status [ER+]: Secondary | ICD-10-CM

## 2021-03-24 DIAGNOSIS — N6489 Other specified disorders of breast: Secondary | ICD-10-CM | POA: Diagnosis not present

## 2021-03-24 DIAGNOSIS — R928 Other abnormal and inconclusive findings on diagnostic imaging of breast: Secondary | ICD-10-CM | POA: Diagnosis not present

## 2021-03-24 MED ORDER — GADOBUTROL 1 MMOL/ML IV SOLN
10.0000 mL | Freq: Once | INTRAVENOUS | Status: AC | PRN
  Administered 2021-03-24: 10 mL via INTRAVENOUS

## 2021-03-27 ENCOUNTER — Telehealth: Payer: Self-pay | Admitting: Genetic Counselor

## 2021-03-27 ENCOUNTER — Encounter: Payer: Self-pay | Admitting: Genetic Counselor

## 2021-03-27 DIAGNOSIS — Z1379 Encounter for other screening for genetic and chromosomal anomalies: Secondary | ICD-10-CM | POA: Insufficient documentation

## 2021-03-27 NOTE — Telephone Encounter (Signed)
Revealed negative testing on the BRCAPlus panel.  We are still waiting on the remainder of the panel which will take another week or so.  Will call when it is ready.

## 2021-04-04 ENCOUNTER — Telehealth: Payer: Self-pay | Admitting: Genetic Counselor

## 2021-04-04 ENCOUNTER — Ambulatory Visit: Payer: Self-pay | Admitting: Genetic Counselor

## 2021-04-04 DIAGNOSIS — C50411 Malignant neoplasm of upper-outer quadrant of right female breast: Secondary | ICD-10-CM

## 2021-04-04 DIAGNOSIS — Z1379 Encounter for other screening for genetic and chromosomal anomalies: Secondary | ICD-10-CM

## 2021-04-04 NOTE — Telephone Encounter (Signed)
Revealed negative genetic testing.  Discussed that we do not know why she has breast cancer or why there is cancer in the family. It could be due to a different gene that we are not testing, or maybe our current technology may not be able to pick something up.  It will be important for her to keep in contact with genetics to keep up with whether additional testing may be needed. 

## 2021-04-04 NOTE — Progress Notes (Signed)
HPI:  Kathleen Sherman was previously seen in the [place] clinic due to a personal and family history of breast cancer and concerns regarding a hereditary predisposition to cancer. Please refer to our prior cancer genetics clinic note for more information regarding our discussion, assessment and recommendations, at the time. Kathleen Sherman recent genetic test results were disclosed to her, as were recommendations warranted by these results. These results and recommendations are discussed in more detail below.  CANCER HISTORY:  Oncology History   No history exists.    FAMILY HISTORY:  We obtained a detailed, 4-generation family history.  Significant diagnoses are listed below: Family History  Problem Relation Age of Onset   Breast cancer Mother 46   Cirrhosis Father    Breast cancer Sister 54   Lung cancer Maternal Aunt    Stomach cancer Paternal Uncle    Stomach cancer Paternal Uncle    Leukemia Maternal Grandmother     The patient has one son who is cancer free.  She has two sisters and five brothers.  One sister had breast cancer at 59.    The patient's mother had breast cancer at 49.  She has several siblings, two sisters had lung cancer.  Her mother had leukemia.   The patient's father died of liver cancer.  He had five brothers and a sister.  Two brothers had stomach cancer.   Kathleen Sherman is unaware of previous family history of genetic testing for hereditary cancer risks. Patient's maternal ancestors are of [place] descent, and paternal ancestors are of [place] descent. There is no reported Ashkenazi Jewish ancestry. There is no known consanguinity.    GENETIC TEST RESULTS: Genetic testing reported out on March 30, 2021 through the CancerNext-Expanded+RNAinsight cancer panel found no pathogenic mutations. The CancerNext-Expanded gene panel offered by [facility] and includes sequencing and rearrangement analysis for the following 77 genes: AIP, ALK, APC*,  ATM*, AXIN2, BAP1, BARD1, BLM, BMPR1A, BRCA1*, BRCA2*, BRIP1*, CDC73, CDH1*, CDK4, CDKN1B, CDKN2A, CHEK2*, CTNNA1, DICER1, FANCC, FH, FLCN, GALNT12, KIF1B, LZTR1, MAX, MEN1, MET, MLH1*, MSH2*, MSH3, MSH6*, MUTYH*, NBN, NF1*, NF2, NTHL1, PALB2*, PHOX2B, PMS2*, POT1, PRKAR1A, PTCH1, PTEN*, RAD51C*, RAD51D*, RB1, RECQL, RET, SDHA, SDHAF2, SDHB, SDHC, SDHD, SMAD4, SMARCA4, SMARCB1, SMARCE1, STK11, SUFU, TMEM127, TP53*, TSC1, TSC2, VHL and XRCC2 (sequencing and deletion/duplication); EGFR, EGLN1, HOXB13, KIT, MITF, PDGFRA, POLD1, and POLE (sequencing only); EPCAM and GREM1 (deletion/duplication only). DNA and RNA analyses performed for * genes. The test report has been scanned into EPIC and is located under the Molecular Pathology section of the Results Review tab.  A portion of the result report is included below for reference.     We discussed with Kathleen Sherman that because current genetic testing is not perfect, it is possible there may be a gene mutation in one of these genes that current testing cannot detect, but that chance is small.  We also discussed, that there could be another gene that has not yet been discovered, or that we have not yet tested, that is responsible for the cancer diagnoses in the family. It is also possible there is a hereditary cause for the cancer in the family that Kathleen Sherman did not inherit and therefore was not identified in her testing.  Therefore, it is important to remain in touch with cancer genetics in the future so that we can continue to offer Kathleen Sherman the most up to date genetic testing.   ADDITIONAL GENETIC TESTING: We discussed with Kathleen Sherman that her genetic  testing was fairly extensive.  If there are genes identified to increase cancer risk that can be analyzed in the future, we would be happy to discuss and coordinate this testing at that time.    CANCER SCREENING RECOMMENDATIONS: Kathleen Sherman test result is considered negative (normal).  This means that we  have not identified a hereditary cause for her personal and family history of breast cancer at this time. Most cancers happen by chance and this negative test suggests that her cancer may fall into this category.    While reassuring, this does not definitively rule out a hereditary predisposition to cancer. It is still possible that there could be genetic mutations that are undetectable by current technology. There could be genetic mutations in genes that have not been tested or identified to increase cancer risk.  Therefore, it is recommended she continue to follow the cancer management and screening guidelines provided by her oncology and primary healthcare provider.   An individual's cancer risk and medical management are not determined by genetic test results alone. Overall cancer risk assessment incorporates additional factors, including personal medical history, family history, and any available genetic information that may result in a personalized plan for cancer prevention and surveillance  RECOMMENDATIONS FOR FAMILY MEMBERS:  Individuals in this family might be at some increased risk of developing cancer, over the general population risk, simply due to the family history of cancer.  We recommended women in this family have a yearly mammogram beginning at age 51, or 74 years younger than the earliest onset of cancer, an annual clinical breast exam, and perform monthly breast self-exams. Women in this family should also have a gynecological exam as recommended by their primary provider. All family members should be referred for colonoscopy starting at age 86.  FOLLOW-UP: Lastly, we discussed with Kathleen Sherman that cancer genetics is a rapidly advancing field and it is possible that new genetic tests will be appropriate for her and/or her family members in the future. We encouraged her to remain in contact with cancer genetics on an annual basis so we can update her personal and family histories and let  her know of advances in cancer genetics that may benefit this family.   Our contact number was provided. Kathleen Sherman questions were answered to her satisfaction, and she knows she is welcome to call us at anytime with additional questions or concerns.   Roma Kayser, [place], [facility] Licensed, Certified Genetic Counselor [email or web address]

## 2021-04-14 DIAGNOSIS — Z170 Estrogen receptor positive status [ER+]: Secondary | ICD-10-CM | POA: Diagnosis not present

## 2021-04-14 DIAGNOSIS — C50411 Malignant neoplasm of upper-outer quadrant of right female breast: Secondary | ICD-10-CM | POA: Diagnosis not present

## 2021-04-14 DIAGNOSIS — N6489 Other specified disorders of breast: Secondary | ICD-10-CM | POA: Diagnosis not present

## 2021-04-18 NOTE — Progress Notes (Signed)
Phone contact with pt.Patient has met with Dr. Lilia Pro and is scheduled to meet with the plastic surgeon. Pt is considering reconstruction but is unsure at this point. Encouraged pt to keep appointment with the plastic surgeon before she made a decision so that her questions could be answered accurately and she could make an educated decision.

## 2021-04-21 ENCOUNTER — Institutional Professional Consult (permissible substitution): Payer: Medicare Other | Admitting: Plastic Surgery

## 2021-05-03 ENCOUNTER — Ambulatory Visit: Payer: Medicare Other | Admitting: Plastic Surgery

## 2021-05-03 ENCOUNTER — Encounter: Payer: Self-pay | Admitting: Plastic Surgery

## 2021-05-03 ENCOUNTER — Other Ambulatory Visit: Payer: Self-pay

## 2021-05-03 VITALS — BP 143/87 | HR 84 | Ht 62.0 in | Wt 200.0 lb

## 2021-05-03 DIAGNOSIS — C50411 Malignant neoplasm of upper-outer quadrant of right female breast: Secondary | ICD-10-CM

## 2021-05-03 DIAGNOSIS — Z170 Estrogen receptor positive status [ER+]: Secondary | ICD-10-CM

## 2021-05-03 NOTE — Progress Notes (Signed)
   Referring Provider Ernestene Kiel, MD [place]. [place],  [place] [address]   CC:  Chief Complaint  Patient presents with   Advice Only      Kathleen Sherman is an 60 y.o. female.  HPI: Patient presents to discuss reconstruction.  She was recently diagnosed with a new right-sided breast cancer.  She is seeing Dr. Lilia Pro as her breast surgeon.  They are planning right sided mastectomy.  She is interested in what her reconstructive options would be.  She is currently a 44 D and wants to stay about the same size.  She does not smoke.  She is a diabetic but hemoglobin A1c is 6.6.  No Known Allergies  Outpatient Encounter Medications as of 05/03/2021  Medication Sig   ALPRAZolam (XANAX) 0.5 MG tablet Take 0.5 mg by mouth 2 (two) times daily as needed for anxiety (may use up to 1 mg prn).   amitriptyline (ELAVIL) 25 MG tablet Take 25 mg by mouth at bedtime.   ascorbic acid (VITAMIN C) 1000 MG tablet Take 1,000 mg by mouth daily.   atorvastatin (LIPITOR) 10 MG tablet Take 10 mg by mouth 3 (three) times a week.   metFORMIN (GLUCOPHAGE-XR) 500 MG 24 hr tablet Take 500 mg by mouth daily.   No facility-administered encounter medications on file as of 05/03/2021.     Past Medical History:  Diagnosis Date   Family history of breast cancer     Family History  Problem Relation Age of Onset   Breast cancer Mother 30   Cirrhosis Father    Breast cancer Sister 86   Lung cancer Maternal Aunt    Stomach cancer Paternal Uncle    Stomach cancer Paternal Uncle    Leukemia Maternal Grandmother     Social History   Social History Narrative   Not on file     Review of Systems General: Denies fevers, chills, weight loss CV: Denies chest pain, shortness of breath, palpitations  Physical Exam Vitals with BMI 05/03/2021 01/03/2021 07/07/2020  Height 5\' 2"  5\' 2"  -  Weight 200 lbs 203 lbs 8 oz -  BMI 60.45 40.98 -  Systolic 119 147 829  Diastolic 87 85 76  Pulse 84 102 84    General:  No  acute distress,  Alert and oriented, Non-Toxic, Normal speech and affect Breast: She has grade 3 ptosis.  No obvious scars.  Base width 13 cm approximately.  She is reasonably symmetric.  Assessment/Plan Patient presents with a new diagnosis of right-sided breast cancer.  We discussed implant-based reconstruction.  We discussed the placement of a tissue expander at the time of her mastectomy followed by an implant at a subsequent procedure.  We discussed risk that include bleeding, infection, damage to surrounding structures need for additional procedures.  We discussed wound healing complications that might result in the need for removal of the implant.  We discussed that symmetry procedures to the left side would likely be needed down the line.  We discussed the general expectations in terms of postoperative recovery.  We discussed delayed reconstruction in the event that she chooses not to proceed with immediate reconstruction.  I did explain that this should be feasible for her but could be complicated if she does require postoperative radiation therapy.  She is going to think it over and we will plan to coordinate with Dr. Lilia Pro if needed.  Cindra Presume 05/03/2021, 4:35 PM

## 2021-06-05 DIAGNOSIS — N6092 Unspecified benign mammary dysplasia of left breast: Secondary | ICD-10-CM | POA: Diagnosis not present

## 2021-06-05 DIAGNOSIS — N6320 Unspecified lump in the left breast, unspecified quadrant: Secondary | ICD-10-CM | POA: Diagnosis not present

## 2021-06-05 DIAGNOSIS — J45909 Unspecified asthma, uncomplicated: Secondary | ICD-10-CM | POA: Diagnosis not present

## 2021-06-05 DIAGNOSIS — N6489 Other specified disorders of breast: Secondary | ICD-10-CM | POA: Diagnosis not present

## 2021-06-05 DIAGNOSIS — K219 Gastro-esophageal reflux disease without esophagitis: Secondary | ICD-10-CM | POA: Diagnosis not present

## 2021-06-05 DIAGNOSIS — Z79891 Long term (current) use of opiate analgesic: Secondary | ICD-10-CM | POA: Diagnosis not present

## 2021-06-05 DIAGNOSIS — D4989 Neoplasm of unspecified behavior of other specified sites: Secondary | ICD-10-CM | POA: Diagnosis not present

## 2021-06-05 DIAGNOSIS — C50911 Malignant neoplasm of unspecified site of right female breast: Secondary | ICD-10-CM | POA: Diagnosis not present

## 2021-06-05 DIAGNOSIS — Z7984 Long term (current) use of oral hypoglycemic drugs: Secondary | ICD-10-CM | POA: Diagnosis not present

## 2021-06-05 DIAGNOSIS — F17210 Nicotine dependence, cigarettes, uncomplicated: Secondary | ICD-10-CM | POA: Diagnosis not present

## 2021-06-05 DIAGNOSIS — R928 Other abnormal and inconclusive findings on diagnostic imaging of breast: Secondary | ICD-10-CM | POA: Diagnosis not present

## 2021-06-05 DIAGNOSIS — C50411 Malignant neoplasm of upper-outer quadrant of right female breast: Secondary | ICD-10-CM | POA: Diagnosis not present

## 2021-06-05 DIAGNOSIS — Z170 Estrogen receptor positive status [ER+]: Secondary | ICD-10-CM | POA: Diagnosis not present

## 2021-06-05 DIAGNOSIS — E7800 Pure hypercholesterolemia, unspecified: Secondary | ICD-10-CM | POA: Diagnosis not present

## 2021-06-05 DIAGNOSIS — Z79899 Other long term (current) drug therapy: Secondary | ICD-10-CM | POA: Diagnosis not present

## 2021-06-05 DIAGNOSIS — E119 Type 2 diabetes mellitus without complications: Secondary | ICD-10-CM | POA: Diagnosis not present

## 2021-06-06 DIAGNOSIS — C50411 Malignant neoplasm of upper-outer quadrant of right female breast: Secondary | ICD-10-CM | POA: Diagnosis not present

## 2021-06-06 DIAGNOSIS — N6489 Other specified disorders of breast: Secondary | ICD-10-CM | POA: Diagnosis not present

## 2021-06-06 DIAGNOSIS — E119 Type 2 diabetes mellitus without complications: Secondary | ICD-10-CM | POA: Diagnosis not present

## 2021-06-06 DIAGNOSIS — Z79891 Long term (current) use of opiate analgesic: Secondary | ICD-10-CM | POA: Diagnosis not present

## 2021-06-06 DIAGNOSIS — K219 Gastro-esophageal reflux disease without esophagitis: Secondary | ICD-10-CM | POA: Diagnosis not present

## 2021-06-06 DIAGNOSIS — Z79899 Other long term (current) drug therapy: Secondary | ICD-10-CM | POA: Diagnosis not present

## 2021-06-06 DIAGNOSIS — Z7984 Long term (current) use of oral hypoglycemic drugs: Secondary | ICD-10-CM | POA: Diagnosis not present

## 2021-06-06 DIAGNOSIS — Z170 Estrogen receptor positive status [ER+]: Secondary | ICD-10-CM | POA: Diagnosis not present

## 2021-06-06 DIAGNOSIS — J45909 Unspecified asthma, uncomplicated: Secondary | ICD-10-CM | POA: Diagnosis not present

## 2021-06-06 DIAGNOSIS — E7800 Pure hypercholesterolemia, unspecified: Secondary | ICD-10-CM | POA: Diagnosis not present

## 2021-06-20 ENCOUNTER — Telehealth: Payer: Self-pay | Admitting: Oncology

## 2021-06-20 NOTE — Telephone Encounter (Signed)
Patient referred by Dr Orrin Brigham for New Dx:  Breast CA.  Appt made 07/11/2021 Consult at 2:15 pm (41 Min per Dr Bobby Rumpf)

## 2021-06-30 DIAGNOSIS — Z28310 Unvaccinated for covid-19: Secondary | ICD-10-CM | POA: Diagnosis not present

## 2021-06-30 DIAGNOSIS — J069 Acute upper respiratory infection, unspecified: Secondary | ICD-10-CM | POA: Diagnosis not present

## 2021-06-30 DIAGNOSIS — E119 Type 2 diabetes mellitus without complications: Secondary | ICD-10-CM | POA: Diagnosis not present

## 2021-06-30 DIAGNOSIS — Z7984 Long term (current) use of oral hypoglycemic drugs: Secondary | ICD-10-CM | POA: Diagnosis not present

## 2021-06-30 NOTE — Progress Notes (Incomplete)
[facility]  [address] [place],  [place]  [address] [phone]  Clinic Day:  07/06/2021  Referring physician: Ernestene Kiel, MD  This document serves as a record of services personally performed by Marice Potter, MD. It was created on their behalf by [facility], a trained medical scribe. The creation of this record is based on the scribe's personal observations and the provider's statements to them.  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  Although she is only a carrier, she has the most aggressive strain of hemochromatosis mutation, which led to her last phlebotomy being done 6 months ago.  She comes in today to reassess her iron parameters.  Since her last visit, the patient has been doing well.  She denies having any significant myalgias/arthralgias which concern her for complications related to her underlying hemochromatosis.    PHYSICAL EXAM:  There were no vitals taken for this visit. Wt Readings from Last 3 Encounters:  05/03/21 200 lb (90.7 kg)  01/03/21 203 lb 8 oz (92.3 kg)  07/07/20 208 lb (94.3 kg)   There is no height or weight on file to calculate BMI. Performance status (ECOG): 0 Physical Exam Constitutional:      Appearance: Normal appearance. She is not ill-appearing.  HENT:     Mouth/Throat:     Mouth: Mucous membranes are moist.     Pharynx: Oropharynx is clear. No oropharyngeal exudate or posterior oropharyngeal erythema.  Cardiovascular:     Rate and Rhythm: Normal rate and regular rhythm.     Heart sounds: No murmur heard.   No friction rub. No gallop.  Pulmonary:     Effort: Pulmonary effort is normal. No respiratory distress.     Breath sounds: Normal breath sounds. No wheezing, rhonchi or rales.  Abdominal:     General: Bowel sounds are normal. There is no distension.     Palpations: Abdomen is soft. There is no mass.     Tenderness:  There is no abdominal tenderness.  Musculoskeletal:        General: No swelling.     Right lower leg: No edema.     Left lower leg: No edema.  Lymphadenopathy:     Cervical: No cervical adenopathy.     Upper Body:     Right upper body: No supraclavicular or axillary adenopathy.     Left upper body: No supraclavicular or axillary adenopathy.     Lower Body: No right inguinal adenopathy. No left inguinal adenopathy.  Skin:    General: Skin is warm.     Coloration: Skin is not jaundiced.     Findings: No lesion or rash.  Neurological:     General: No focal deficit present.     Mental Status: She is alert and oriented to person, place, and time. Mental status is at baseline.  Psychiatric:        Mood and Affect: Mood normal.        Behavior: Behavior normal.        Thought Content: Thought content normal.    LABS:    Ref. Range 12/30/2020 14:50  Iron Latest Ref Range: 28 - 170 ug/dL 99  UIBC Latest Units: ug/dL 201  TIBC Latest Ref Range: 250 - 450 ug/dL 300  Saturation Ratios Latest Ref Range: 10.4 - 31.8 % 33 (H)  Ferritin Latest Ref Range: 11 - 307 ng/mL 249    Ref. Range  12/30/2020 00:00  Sodium Latest Ref Range: 137 - 147  137  Potassium Latest Ref Range: 3.4 - 5.3  3.9  Chloride Latest Ref Range: 99 - 108  99  CO2 Latest Ref Range: 13 - 22  31 (A)  Glucose Unknown 103  BUN Latest Ref Range: 4 - 21  7  Creatinine Latest Ref Range: 0.5 - 1.1  0.6  Calcium Latest Ref Range: 8.7 - 10.7  9.1  Alkaline Phosphatase Latest Ref Range: 25 - 125  101  Albumin Latest Ref Range: 3.5 - 5.0  4.3  AST Latest Ref Range: 13 - 35  27  ALT Latest Ref Range: 7 - 35  21  Bilirubin, Total Unknown 0.8  WBC Unknown 6.5  RBC Latest Ref Range: 3.87 - 5.11  4.6  Hemoglobin Latest Ref Range: 12.0 - 16.0  14.7  HCT Latest Ref Range: 36 - 46  44  Platelets Latest Ref Range: 150 - 399  212  NEUT# Unknown 4.03   ASSESSMENT & PLAN:  Assessment/Plan:  A 60 y.o. female with hemochromatosis (1 C282Y  mutation).  I am pleased as her ferritin has fallen back to a normal level.  As she is only a carrier for hemochromatosis, no additional phlebotomies will be scheduled for now.   I will see her back in 6 months to reassess her iron parameters.  The patient understands all the plans discussed today and is in agreement with them.     I, Rita Ohara, am acting as scribe for Marice Potter, MD    I have reviewed this report as typed by the medical scribe, and it is complete and accurate.  Dequincy Macarthur Critchley, MD

## 2021-07-05 ENCOUNTER — Other Ambulatory Visit: Payer: Medicare Other

## 2021-07-06 ENCOUNTER — Ambulatory Visit: Payer: Medicare Other | Admitting: Oncology

## 2021-07-10 ENCOUNTER — Other Ambulatory Visit: Payer: Medicare Other

## 2021-07-11 ENCOUNTER — Other Ambulatory Visit: Payer: Self-pay

## 2021-07-11 ENCOUNTER — Telehealth: Payer: Self-pay | Admitting: Oncology

## 2021-07-11 ENCOUNTER — Other Ambulatory Visit: Payer: Self-pay | Admitting: Oncology

## 2021-07-11 ENCOUNTER — Inpatient Hospital Stay: Payer: Medicare Other | Attending: Oncology | Admitting: Oncology

## 2021-07-11 ENCOUNTER — Inpatient Hospital Stay: Payer: Medicare Other

## 2021-07-11 ENCOUNTER — Other Ambulatory Visit: Payer: Self-pay | Admitting: Hematology and Oncology

## 2021-07-11 DIAGNOSIS — Z7984 Long term (current) use of oral hypoglycemic drugs: Secondary | ICD-10-CM | POA: Diagnosis not present

## 2021-07-11 DIAGNOSIS — C50411 Malignant neoplasm of upper-outer quadrant of right female breast: Secondary | ICD-10-CM

## 2021-07-11 DIAGNOSIS — Z5189 Encounter for other specified aftercare: Secondary | ICD-10-CM | POA: Diagnosis not present

## 2021-07-11 DIAGNOSIS — Z5111 Encounter for antineoplastic chemotherapy: Secondary | ICD-10-CM | POA: Diagnosis not present

## 2021-07-11 DIAGNOSIS — Z801 Family history of malignant neoplasm of trachea, bronchus and lung: Secondary | ICD-10-CM | POA: Diagnosis not present

## 2021-07-11 DIAGNOSIS — G629 Polyneuropathy, unspecified: Secondary | ICD-10-CM | POA: Diagnosis not present

## 2021-07-11 DIAGNOSIS — Z170 Estrogen receptor positive status [ER+]: Secondary | ICD-10-CM

## 2021-07-11 DIAGNOSIS — Z79899 Other long term (current) drug therapy: Secondary | ICD-10-CM | POA: Insufficient documentation

## 2021-07-11 DIAGNOSIS — C50911 Malignant neoplasm of unspecified site of right female breast: Secondary | ICD-10-CM | POA: Diagnosis present

## 2021-07-11 DIAGNOSIS — Z803 Family history of malignant neoplasm of breast: Secondary | ICD-10-CM | POA: Diagnosis not present

## 2021-07-11 DIAGNOSIS — R5383 Other fatigue: Secondary | ICD-10-CM | POA: Insufficient documentation

## 2021-07-11 DIAGNOSIS — C50919 Malignant neoplasm of unspecified site of unspecified female breast: Secondary | ICD-10-CM | POA: Insufficient documentation

## 2021-07-11 LAB — CBC AND DIFFERENTIAL
HCT: 44 (ref 36–46)
Hemoglobin: 14.4 (ref 12.0–16.0)
Neutrophils Absolute: 6
Platelets: 312 (ref 150–399)
WBC: 8.7

## 2021-07-11 LAB — HEPATIC FUNCTION PANEL
ALT: 24 (ref 7–35)
AST: 27 (ref 13–35)
Alkaline Phosphatase: 103 (ref 25–125)
Bilirubin, Total: 0.6

## 2021-07-11 LAB — COMPREHENSIVE METABOLIC PANEL
Albumin: 3.9 (ref 3.5–5.0)
Calcium: 8.8 (ref 8.7–10.7)

## 2021-07-11 LAB — BASIC METABOLIC PANEL
BUN: 6 (ref 4–21)
CO2: 28 — AB (ref 13–22)
Chloride: 104 (ref 99–108)
Creatinine: 0.6 (ref 0.5–1.1)
Glucose: 109
Potassium: 3.6 (ref 3.4–5.3)
Sodium: 137 (ref 137–147)

## 2021-07-11 LAB — IRON AND TIBC
Iron: 76 ug/dL (ref 28–170)
Saturation Ratios: 27 % (ref 10.4–31.8)
TIBC: 277 ug/dL (ref 250–450)
UIBC: 201 ug/dL

## 2021-07-11 LAB — CBC: RBC: 4.46 (ref 3.87–5.11)

## 2021-07-11 LAB — FERRITIN: Ferritin: 395 ng/mL — ABNORMAL HIGH (ref 11–307)

## 2021-07-11 NOTE — Telephone Encounter (Signed)
Per 1/10 los next appt scheduled and given to patient

## 2021-07-11 NOTE — Progress Notes (Signed)
[facility]  [address] [place],  [place]  [address] [phone]  Clinic Day:  07/12/2021  Referring physician: Ernestene Kiel, MD   HISTORY OF PRESENT ILLNESS:  The patient is a 61 y.o. female who I was asked to consult upon for newly diagnosed breast cancer.  Her history dates back to November 2021 when both her screening and diagnostic mammograms showed modest asymmetry in the lateral aspects of her right breast.  This led to a repeat diagnostic mammogram being done in August 2022, which revealed more obvious findings of a mass measuring 8 millimeters in maximum dimension.  This was further confirmed per breast ultrasound.  A core biopsy was done shortly thereafter, which confirmed the presence of invasive carcinoma.  Of note, this patient also ended up undergoing a bilateral breast MRI, which showed 2 other areas of nonspecific enhancement in her right breast.  An area of a similar finding was also seen in her left breast.  Based upon these findings, the patient elected to undergo a right mastectomy, as well as a left breast lumpectomy.  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  Receptor testing showed her tumor to be estrogen receptor positive, progesterone receptor positive, but Her2/neu receptor negative.  One sentinel lymph node was removed, which came back positive for nodal involvement.  The lumpectomy for the lesion in her left breast showed a complex sclerosing lesion.  No DCIS or invasive carcinoma was appreciated.  The patient comes in today to go over her breast cancer surgical pathology and its implications.   The patient denies ever having any symptoms or findings which alerted her to breast cancer being present.  She has been undergoing annual mammograms religiously.  Of note, her mother was diagnosed with breast cancer last year.  She also has a sister who was diagnosed with  breast cancer at age 42.  There is no family history of ovarian cancer.  PAST MEDICAL HISTORY:  Heterozygosity  for hemochromatosis  PAST SURGICAL HISTORY:   Past Surgical History:  Procedure Laterality Date   BREAST BIOPSY Right    BREAST BIOPSY Right 03/20/2021   x2    CURRENT MEDICATIONS:   Current Outpatient Medications  Medication Sig Dispense Refill   ALPRAZolam (XANAX) 0.5 MG tablet Take 0.5 mg by mouth 2 (two) times daily as needed for anxiety (may use up to 1 mg prn).     amitriptyline (ELAVIL) 25 MG tablet Take 25 mg by mouth at bedtime.     ascorbic acid (VITAMIN C) 1000 MG tablet Take 1,000 mg by mouth daily.     atorvastatin (LIPITOR) 10 MG tablet Take 10 mg by mouth 3 (three) times a week.     benzonatate (TESSALON) 100 MG capsule Take 100-200 mg by mouth 3 (three) times daily as needed.     dexamethasone (DECADRON) 4 MG tablet Take 2 tablets (8 mg total) by mouth 2 (two) times daily. Start the day before Taxotere. Then again the day after chemo for 3 days. 30 tablet 1   metFORMIN (GLUCOPHAGE-XR) 500 MG 24 hr tablet Take 500 mg by mouth daily.     ondansetron (ZOFRAN) 4 MG tablet Take 1 tablet (4 mg total) by mouth every 4 (four) hours as needed for nausea. 90 tablet 3   oxyCODONE (OXY IR/ROXICODONE) 5 MG immediate release tablet Take 5 mg by mouth every 4 (four) hours as needed.  prochlorperazine (COMPAZINE) 10 MG tablet Take 1 tablet (10 mg total) by mouth every 6 (six) hours as needed (Nausea or vomiting). 30 tablet 1   No current facility-administered medications for this visit.    ALLERGIES:  Not on File  FAMILY HISTORY:   Family History  Problem Relation Age of Onset   Breast cancer Mother 27   Cirrhosis Father    Breast cancer Sister 12   Lung cancer Maternal Aunt    Stomach cancer Paternal Uncle    Stomach cancer Paternal Uncle    Leukemia Maternal Grandmother     SOCIAL HISTORY:   reports that she has quit smoking. She has never used  smokeless tobacco. No history on file for alcohol use and drug use.  REVIEW OF SYSTEMS:  Review of Systems  Constitutional:  Negative for fatigue and fever.  HENT:   Negative for hearing loss and sore throat.   Eyes:  Negative for eye problems.  Respiratory:  Negative for chest tightness, cough and hemoptysis.   Cardiovascular:  Negative for chest pain and palpitations.  Gastrointestinal:  Negative for abdominal distention, abdominal pain, blood in stool, constipation, diarrhea, nausea and vomiting.  Endocrine: Negative for hot flashes.  Genitourinary:  Negative for difficulty urinating, dysuria, frequency, hematuria and nocturia.   Musculoskeletal:  Negative for arthralgias, back pain, gait problem and myalgias.  Skin: Negative.  Negative for itching and rash.  Neurological: Negative.  Negative for dizziness, extremity weakness, gait problem, headaches, light-headedness and numbness.  Hematological: Negative.   Psychiatric/Behavioral: Negative.  Negative for depression and suicidal ideas. The patient is not nervous/anxious.     PHYSICAL EXAM:  There were no vitals taken for this visit. Wt Readings from Last 3 Encounters:  07/12/21 198 lb 8 oz (90 kg)  05/03/21 200 lb (90.7 kg)  01/03/21 203 lb 8 oz (92.3 kg)   There is no height or weight on file to calculate BMI. Performance status (ECOG): 0 - Asymptomatic Physical Exam Constitutional:      Appearance: Normal appearance.  HENT:     Mouth/Throat:     Pharynx: Oropharynx is clear. No oropharyngeal exudate.  Cardiovascular:     Rate and Rhythm: Normal rate and regular rhythm.     Heart sounds: No murmur heard.   No friction rub. No gallop.  Pulmonary:     Breath sounds: Normal breath sounds.  Chest:  Breasts:    Right: No swelling, bleeding, inverted nipple, mass, nipple discharge or skin change.     Left: No swelling, bleeding, inverted nipple, mass, nipple discharge or skin change.  Abdominal:     General: Bowel sounds  are normal. There is no distension.     Palpations: Abdomen is soft. There is no mass.     Tenderness: There is no abdominal tenderness.  Musculoskeletal:        General: No tenderness.     Cervical back: Normal range of motion and neck supple.     Right lower leg: No edema.     Left lower leg: No edema.  Lymphadenopathy:     Cervical: No cervical adenopathy.     Right cervical: No superficial, deep or posterior cervical adenopathy.    Left cervical: No superficial, deep or posterior cervical adenopathy.     Upper Body:     Right upper body: No supraclavicular or axillary adenopathy.     Left upper body: No supraclavicular or axillary adenopathy.     Lower Body: No right inguinal adenopathy. No left  inguinal adenopathy.  Skin:    Coloration: Skin is not jaundiced.     Findings: No lesion or rash.  Neurological:     General: No focal deficit present.     Mental Status: She is alert and oriented to person, place, and time. Mental status is at baseline.  Psychiatric:        Mood and Affect: Mood normal.        Behavior: Behavior normal.        Thought Content: Thought content normal.        Judgment: Judgment normal.    LABS:   CBC Latest Ref Rng & Units 07/11/2021 12/30/2020 07/04/2020  WBC - 8.7 6.5 7.0  Hemoglobin 12.0 - 16.0 14.4 14.7 14.1  Hematocrit 36 - 46 44 44 41  Platelets 150 - 399 312 212 218   CMP Latest Ref Rng & Units 07/11/2021 12/30/2020 07/04/2020  BUN 4 - $R'21 6 7 10  'Ae$ Creatinine 0.5 - 1.1 0.6 0.6 0.8  Sodium 137 - 147 137 137 138  Potassium 3.4 - 5.3 3.6 3.9 3.9  Chloride 99 - 108 104 99 102  CO2 13 - 22 28(A) 31(A) 29(A)  Calcium 8.7 - 10.7 8.8 9.1 9.4  Alkaline Phos 25 - 125 103 101 98  AST 13 - 35 $Re'27 27 25  'YAh$ ALT 7 - 35 $Re'24 21 24     'UZG$ Lab Results  Component Value Date   TIBC 277 07/11/2021   TIBC 300 12/30/2020   TIBC 262 07/04/2020   FERRITIN 395 (H) 07/11/2021   FERRITIN 249 12/30/2020   FERRITIN 314 07/04/2020   IRONPCTSAT 27 07/11/2021   IRONPCTSAT  33 (H) 12/30/2020   IRONPCTSAT 27.0 07/04/2020   No results found for: LDH  Recent Review Flowsheet Data     Oncology Labs Latest Ref Rng & Units 07/04/2020 12/30/2020 07/11/2021   FERRITIN 11 - 307 ng/mL 314 249 395(H)   IRONPCTSAT 10.4 - 31.8 % 27.0 33(H) 27      ASSESSMENT & PLAN:   Kathleen Sherman is a 61 y.o. 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  In clinic today, I went over all of her breast cancer pathology with her.  Based upon her nodal positivity, larger than average tumor mass, elevated Ki-67 score, and generally younger age, I do believe this patient does warrant some form of adjuvant chemotherapy.  I will place her on 4 cycles of Taxotere/Cytoxan, with the cycles being repeated every 3 weeks.  The patient was made aware of the side effects which go along with this regimen, including alopecia, neuropathy, nausea, and cytopenias.  She will receive Neulasta to prevent severe neutropenia from delaying successive cycles of chemotherapy.  Due to her axillary nodal positivity, she will need adjuvant breast radiation.  As her breast cancer is hormone sensitive, she will also be placed for 5 years of letrozole for her adjuvant endocrine therapy.  Her radiation and endocrine therapy will start after she completes her 4 cycles of Taxotere/Cytoxan.  Her first cycle will be tentatively scheduled in the next 2 to 3 weeks.  I will see her 3 weeks later before she heads into her 2nd cycle of adjuvant Taxotere/Cytoxan.  The patient understands all the plans discussed today and is in agreement with them.    I do appreciate Ernestene Kiel, MD for his new consult.  Mason Burleigh Macarthur Critchley, MD

## 2021-07-11 NOTE — Progress Notes (Signed)
Face to face with pt in [place]. Patient is here today for her initial Med Onc visit with Dr. Bobby Rumpf. Pt will be discussing chemo today. Her surgery is complete. She denies any complications from surgery,

## 2021-07-11 NOTE — Progress Notes (Signed)
[facility] NOTE Patient Care Team: Laverle Hobby, NP as PCP - General (Nurse Practitioner) Laurell Roof, RN as Registered Nurse Pollyann Samples, MD as Consulting Physician (General Surgery)   Name of the patient: Kathleen Sherman  [id number]  [birth year]   Date of visit: 07/11/21  Diagnosis- Breast Cancer  Chief complaint/Reason for visit- Initial Meeting for [facility], preparing for starting chemotherapy  Heme/Onc history:  Oncology History  Breast cancer ([place])  07/11/2021 Initial Diagnosis   Breast cancer ([place])   07/20/2021 -  Chemotherapy   Patient is on Treatment Plan : BREAST TC q21d       Interval history-  Patient presents to chemo care clinic today for initial meeting in preparation for starting chemotherapy. I introduced the chemo care clinic and we discussed that the role of the clinic is to assist those who are at an increased risk of emergency room visits and/or complications during the course of chemotherapy treatment. We discussed that the increased risk takes into account factors such as age, performance status, and co-morbidities. We also discussed that for some, this might include barriers to care such as not having a primary care provider, lack of insurance/transportation, or not being able to afford medications. We discussed that the goal of the program is to help prevent unplanned ER visits and help reduce complications during chemotherapy. We do this by discussing specific risk factors to each individual and identifying ways that we can help improve these risk factors and reduce barriers to care.   No Known Allergies  Past Medical History:  Diagnosis Date   Family history of breast cancer       Social History   Socioeconomic History   Marital status: Single    Spouse name: Not on file   Number of children: Not on file   Years of education: Not on file   Highest education level: Not on file  Occupational History   Not on file   Tobacco Use   Smoking status: Former   Smokeless tobacco: Never  Substance and Sexual Activity   Alcohol use: Not on file   Drug use: Not on file   Sexual activity: Not on file  Other Topics Concern   Not on file  Social History Narrative   Not on file   Social Determinants of Health   Financial Resource Strain: Not on file  Food Insecurity: Not on file  Transportation Needs: Not on file  Physical Activity: Not on file  Stress: Not on file  Social Connections: Not on file  Intimate Partner Violence: Not on file    Family History  Problem Relation Age of Onset   Breast cancer Mother 23   Cirrhosis Father    Breast cancer Sister 52   Lung cancer Maternal Aunt    Stomach cancer Paternal Uncle    Stomach cancer Paternal Uncle    Leukemia Maternal Grandmother      Current Outpatient Medications:    ALPRAZolam (XANAX) 0.5 MG tablet, Take 0.5 mg by mouth 2 (two) times daily as needed for anxiety (may use up to 1 mg prn)., Disp: , Rfl:    amitriptyline (ELAVIL) 25 MG tablet, Take 25 mg by mouth at bedtime., Disp: , Rfl:    ascorbic acid (VITAMIN C) 1000 MG tablet, Take 1,000 mg by mouth daily., Disp: , Rfl:    atorvastatin (LIPITOR) 10 MG tablet, Take 10 mg by mouth 3 (three) times a week., Disp: , Rfl:  metFORMIN (GLUCOPHAGE-XR) 500 MG 24 hr tablet, Take 500 mg by mouth daily., Disp: , Rfl:   CMP Latest Ref Rng & Units 07/11/2021  BUN 4 - 21 6  Creatinine 0.5 - 1.1 0.6  Sodium 137 - 147 137  Potassium 3.4 - 5.3 3.6  Chloride 99 - 108 104  CO2 13 - 22 28(A)  Calcium 8.7 - 10.7 8.8  Alkaline Phos 25 - 125 103  AST 13 - 35 27  ALT 7 - 35 24   CBC Latest Ref Rng & Units 07/11/2021  WBC - 8.7  Hemoglobin 12.0 - 16.0 14.4  Hematocrit 36 - 46 44  Platelets 150 - 399 312    No images are attached to the encounter.  No results found.   Assessment and plan- Patient is a 61 y.o. female who presents to [facility] for initial meeting in preparation for starting  chemotherapy for the treatment of breast cancer.   Chemo Care Clinic/High Risk for ER/Hospitalization during chemotherapy- We discussed the role of the chemo care clinic and identified patient specific risk factors. I discussed that patient was identified as high risk primarily based on:  Patient has past medical history positive for: Past Medical History:  Diagnosis Date   Family history of breast cancer     Patient has past surgical history positive for:   The patient is a with newly diagnosed.  Patient presents to clinic today for chemotherapy education and palliative care consult.  We will start.  We will send in prescriptions for prochlorperazine and ondansetron.  The patient verbalizes understanding of and agreement to the plan as discussed today.  Provided general information including the following: 1.  Date of education: 07/12/2020 2.  Physician name: Dr. Bobby Rumpf 3.  Diagnosis: Breast Cancer 4.  Stage: Stage IA 5.  Curative  6.  Chemotherapy plan including drugs and how often: Docetaxel/ cyclophosphomide IV every 3 weeks for 6 cycles with pegfilgrastim injections 7.  Start date: Pending authorization 8.  Other referrals: None at this time 9.  The patient is to call our office with any questions or concerns.  Our office number [phone], if after hours or on the weekend, call the same number and wait for the answering service.  There is always an oncologist on call 10.  Medications prescribed: Ondansetron, Prochlorperazine, Dexamethasone 11.  The patient has verbalized understanding of the treatment plan and has no barriers to adherence or understanding.  Obtained signed consent from patient.  Discussed symptoms including 1.  Low blood counts including red blood cells, white blood cells and platelets. 2. Infection including to avoid large crowds, wash hands frequently, and stay away from people who were sick.  If fever develops of 100.4 or higher, call our office. 3.   Mucositis-given instructions on mouth rinse (baking soda and salt mixture).  Keep mouth clean.  Use soft bristle toothbrush.  If mouth sores develop, call our clinic. 4.  Nausea/vomiting-gave prescriptions for ondansetron 4 mg every 4 hours as needed for nausea, may take around the clock if persistent.  Compazine 10 mg every 6 hours, may take around the clock if persistent. 5.  Diarrhea-use over-the-counter Imodium.  Call clinic if not controlled. 6.  Constipation-use senna, 1 to 2 tablets twice a day.  If no BM in 2 to 3 days call the clinic. 7.  Loss of appetite-try to eat small meals every 2-3 hours.  Call clinic if not eating. 8.  Taste changes-zinc 500 mg daily.  If  becomes severe call clinic. 9.  Alcoholic beverages. 10.  Drink 2 to 3 quarts of water per day. 11.  Peripheral neuropathy-patient to call if numbness or tingling in hands or feet is persistent  Neulasta-will be given 24 to 48 hours after chemotherapy.  Gave information sheet on bone and joint pain.  Use Claritin or Pepcid.  May use ibuprofen or Aleve.  Call if symptoms persist or are unbearable.  Gave information on the supportive care team and how to contact them regarding services.  Discussed advanced directives.  [employer] Social worker Advanced directives  Answered questions to patient satisfaction.  Patient is to call with any further questions or concerns.   The medication prescribed to the patient will be printed out from [email or web address] This will give the following information: Name of your medication Approved uses Dose and schedule Storage and handling Handling body fluids and waste Drug and food interactions Possible side effects and management Pregnancy, sexual activity, and contraception Obtaining medication  We discussed that social determinants of health may have significant impacts on health and outcomes for cancer patients.  Today we discussed specific social determinants  of performance status, alcohol use, depression, financial needs, food insecurity, housing, interpersonal violence, social connections, stress, tobacco use, and transportation.    After lengthy discussion the following were identified as areas of need:   Outpatient services: We discussed options including home based and outpatient services, DME and care program. We discusssed that patients who participate in regular physical activity report fewer negative impacts of cancer and treatments and report less fatigue.   Financial Concerns: We discussed that living with cancer can create tremendous financial burden.  We discussed options for assistance. I asked that if assistance is needed in affording medications or paying bills to please let us know so that we can provide assistance. We discussed options for food including social services.  We will also notify Mort Sawyers to see if cancer center can provide additional support.  Referral to Social work: Introduced Education officer, museum Mort Sawyers and the services he can provide such as support with utility bill, cell phone and gas vouchers.   Support groups: We discussed options for support groups at the cancer center. If interested, please notify nurse navigator to enroll. We discussed options for managing stress including healthy eating, exercise as well as participating in no charge counseling services at the cancer center and support groups.  If these are of interest, patient can notify either myself or primary nursing team.We discussed options for management including medications and referral to quit Smart program  Transportation: We discussed options for transportation.  I have notified primary oncology team who will help assist with arranging Lucianne Lei transportation for appointments when/if needed. We also discussed options for transportation on short notice/acute visits.  Palliative care services: We have palliative care services available in the cancer  center to discuss goals of care and advanced care planning.  Please let us know if you have any questions or would like to speak to our palliative nurse practitioner.  Symptom Management Clinic: We discussed our symptom management clinic which is available for acute concerns while receiving treatment such as nausea, vomiting or diarrhea.  We can be reached via telephone at [phone] or through my chart.  We are available for virtual or in person visits on the same day from 830 to 4 PM Monday through Friday. She denies needing specific assistance at this time and She will be followed by Dr. Bobby Rumpf clinical team.  Plan: Discussed symptom management clinic. Discussed palliative care services. Discussed resources that are available here at the cancer center. Discussed medications and new prescriptions to begin treatment such as anti-nausea or steroids.   Disposition: RTC on 10 days after start of chemotherapy  Visit Diagnosis No diagnosis found.  Patient expressed understanding and was in agreement with this plan. She also understands that She can call clinic at any time with any questions, concerns, or complaints.   I provided 30 minutes of  face to face  during this encounter, and > 50% was spent counseling as documented under my assessment & plan.   Dayton Scrape, FNP- [facility]

## 2021-07-11 NOTE — Progress Notes (Signed)
START ON PATHWAY REGIMEN - Breast     A cycle is every 21 days:     Docetaxel      Cyclophosphamide   **Always confirm dose/schedule in your pharmacy ordering system**  Patient Characteristics: Postoperative without Neoadjuvant Therapy (Pathologic Staging), Invasive Disease, Adjuvant Therapy, HER2 Negative/Unknown/Equivocal, ER Positive, Node Positive, Node Positive (1-3), Genomic Testing Not Performed, Chemotherapy Candidate Therapeutic Status: Postoperative without Neoadjuvant Therapy (Pathologic Staging) AJCC Grade: G2 AJCC N Category: pN1a AJCC M Category: cM0 ER Status: Positive (+) AJCC 8 Stage Grouping: IA HER2 Status: Negative (-) Oncotype Dx Recurrence Score: Not Appropriate AJCC T Category: pT1c PR Status: Positive (+) Adjuvant Therapy Status: No Adjuvant Therapy Received Yet or Changing Initial Adjuvant Regimen due to Tolerance Has this patient completed genomic testing<= No - Did Not Order Test  Intent of Therapy: Curative Intent, Discussed with Patient

## 2021-07-12 ENCOUNTER — Inpatient Hospital Stay (INDEPENDENT_AMBULATORY_CARE_PROVIDER_SITE_OTHER): Payer: Medicare Other | Admitting: Hematology and Oncology

## 2021-07-12 ENCOUNTER — Encounter: Payer: Self-pay | Admitting: Oncology

## 2021-07-12 ENCOUNTER — Ambulatory Visit: Payer: Medicare Other | Admitting: Hematology and Oncology

## 2021-07-12 ENCOUNTER — Encounter: Payer: Self-pay | Admitting: Hematology and Oncology

## 2021-07-12 VITALS — BP 153/83 | HR 109 | Temp 98.0°F | Resp 18 | Ht 62.0 in | Wt 198.5 lb

## 2021-07-12 DIAGNOSIS — Z170 Estrogen receptor positive status [ER+]: Secondary | ICD-10-CM

## 2021-07-12 DIAGNOSIS — C50411 Malignant neoplasm of upper-outer quadrant of right female breast: Secondary | ICD-10-CM | POA: Diagnosis not present

## 2021-07-12 MED ORDER — ONDANSETRON HCL 4 MG PO TABS: 4.0000 mg | ORAL_TABLET | ORAL | 3 refills | Status: DC | PRN

## 2021-07-12 MED ORDER — DEXAMETHASONE 4 MG PO TABS: 8.0000 mg | ORAL_TABLET | Freq: Two times a day (BID) | ORAL | 1 refills | Status: DC

## 2021-07-12 MED ORDER — PROCHLORPERAZINE MALEATE 10 MG PO TABS: 10.0000 mg | ORAL_TABLET | Freq: Four times a day (QID) | ORAL | 1 refills | Status: DC | PRN

## 2021-07-18 ENCOUNTER — Encounter: Payer: Self-pay | Admitting: Oncology

## 2021-07-21 ENCOUNTER — Other Ambulatory Visit: Payer: Self-pay

## 2021-07-21 ENCOUNTER — Other Ambulatory Visit: Payer: Self-pay | Admitting: Hematology and Oncology

## 2021-07-21 ENCOUNTER — Inpatient Hospital Stay: Payer: Medicare Other

## 2021-07-21 ENCOUNTER — Other Ambulatory Visit: Payer: Medicare Other

## 2021-07-21 DIAGNOSIS — Z170 Estrogen receptor positive status [ER+]: Secondary | ICD-10-CM

## 2021-07-21 LAB — CBC
MCV: 97 (ref 81–99)
RBC: 4.41 (ref 3.87–5.11)

## 2021-07-21 LAB — CBC AND DIFFERENTIAL
HCT: 43 (ref 36–46)
Hemoglobin: 14.1 (ref 12.0–16.0)
Neutrophils Absolute: 3.41
Platelets: 239 (ref 150–399)
WBC: 6.2

## 2021-07-21 LAB — BASIC METABOLIC PANEL
BUN: 11 (ref 4–21)
CO2: 28 — AB (ref 13–22)
Chloride: 103 (ref 99–108)
Creatinine: 0.7 (ref 0.5–1.1)
Glucose: 127
Potassium: 4 (ref 3.4–5.3)
Sodium: 138 (ref 137–147)

## 2021-07-21 LAB — COMPREHENSIVE METABOLIC PANEL
Albumin: 3.9 (ref 3.5–5.0)
Calcium: 8.7 (ref 8.7–10.7)

## 2021-07-21 LAB — HEPATIC FUNCTION PANEL
ALT: 25 (ref 7–35)
AST: 26 (ref 13–35)
Alkaline Phosphatase: 94 (ref 25–125)
Bilirubin, Total: 0.8

## 2021-07-21 MED FILL — Cyclophosphamide For Inj 1 GM: INTRAMUSCULAR | Qty: 59 | Status: AC

## 2021-07-21 MED FILL — Docetaxel Soln for IV Infusion 160 MG/16ML: INTRAVENOUS | Qty: 15 | Status: AC

## 2021-07-21 MED FILL — Dexamethasone Sodium Phosphate Inj 100 MG/10ML: INTRAMUSCULAR | Qty: 1 | Status: AC

## 2021-07-24 ENCOUNTER — Other Ambulatory Visit: Payer: Self-pay | Admitting: Hematology and Oncology

## 2021-07-24 ENCOUNTER — Inpatient Hospital Stay: Payer: Medicare Other

## 2021-07-24 ENCOUNTER — Other Ambulatory Visit: Payer: Self-pay

## 2021-07-24 VITALS — BP 149/74 | HR 98 | Temp 98.1°F | Resp 18 | Ht 62.0 in | Wt 198.0 lb

## 2021-07-24 DIAGNOSIS — Z170 Estrogen receptor positive status [ER+]: Secondary | ICD-10-CM

## 2021-07-24 DIAGNOSIS — C50911 Malignant neoplasm of unspecified site of right female breast: Secondary | ICD-10-CM | POA: Diagnosis not present

## 2021-07-24 MED ORDER — ACETAMINOPHEN 325 MG PO TABS
650.0000 mg | ORAL_TABLET | Freq: Four times a day (QID) | ORAL | Status: DC | PRN
  Administered 2021-07-24: 650 mg via ORAL
  Filled 2021-07-24: qty 2

## 2021-07-24 MED ORDER — HEPARIN SOD (PORK) LOCK FLUSH 100 UNIT/ML IV SOLN
500.0000 [IU] | Freq: Once | INTRAVENOUS | Status: AC | PRN
  Administered 2021-07-24: 500 [IU]

## 2021-07-24 MED ORDER — SODIUM CHLORIDE 0.9 % IV SOLN
75.0000 mg/m2 | Freq: Once | INTRAVENOUS | Status: AC
  Administered 2021-07-24: 150 mg via INTRAVENOUS
  Filled 2021-07-24: qty 15

## 2021-07-24 MED ORDER — SODIUM CHLORIDE 0.9 % IV SOLN: Freq: Once | INTRAVENOUS | Status: AC

## 2021-07-24 MED ORDER — SODIUM CHLORIDE 0.9 % IV SOLN
600.0000 mg/m2 | Freq: Once | INTRAVENOUS | Status: AC
  Administered 2021-07-24: 1180 mg via INTRAVENOUS
  Filled 2021-07-24: qty 59

## 2021-07-24 MED ORDER — PALONOSETRON HCL INJECTION 0.25 MG/5ML
0.2500 mg | Freq: Once | INTRAVENOUS | Status: AC
  Administered 2021-07-24: 0.25 mg via INTRAVENOUS
  Filled 2021-07-24: qty 5

## 2021-07-24 MED ORDER — SODIUM CHLORIDE 0.9% FLUSH
10.0000 mL | INTRAVENOUS | Status: DC | PRN
  Administered 2021-07-24: 10 mL

## 2021-07-24 MED ORDER — SODIUM CHLORIDE 0.9 % IV SOLN
10.0000 mg | Freq: Once | INTRAVENOUS | Status: AC
  Administered 2021-07-24: 10 mg via INTRAVENOUS
  Filled 2021-07-24: qty 10

## 2021-07-24 NOTE — Patient Instructions (Signed)
Cyclophosphamide Injection What is this medication? CYCLOPHOSPHAMIDE (sye kloe FOSS fa mide) is a chemotherapy drug. It slows the growth of cancer cells. This medicine is used to treat many types of cancer like lymphoma, myeloma, leukemia, breast cancer, and ovarian cancer, to name a few. This medicine may be used for other purposes; ask your health care provider or pharmacist if you have questions. COMMON BRAND NAME(S): Cytoxan, Neosar What should I tell my care team before I take this medication? They need to know if you have any of these conditions: heart disease history of irregular heartbeat infection kidney disease liver disease low blood counts, like white cells, platelets, or red blood cells on hemodialysis recent or ongoing radiation therapy scarring or thickening of the lungs trouble passing urine an unusual or allergic reaction to cyclophosphamide, other medicines, foods, dyes, or preservatives pregnant or trying to get pregnant breast-feeding How should I use this medication? This drug is usually given as an injection into a vein or muscle or by infusion into a vein. It is administered in a hospital or clinic by a specially trained health care professional. Talk to your pediatrician regarding the use of this medicine in children. Special care may be needed. Overdosage: If you think you have taken too much of this medicine contact a poison control center or emergency room at once. NOTE: This medicine is only for you. Do not share this medicine with others. What if I miss a dose? It is important not to miss your dose. Call your doctor or health care professional if you are unable to keep an appointment. What may interact with this medication? amphotericin B azathioprine certain antivirals for HIV or hepatitis certain medicines for blood pressure, heart disease, irregular heart beat certain medicines that treat or prevent blood clots like warfarin certain other medicines for  cancer cyclosporine etanercept indomethacin medicines that relax muscles for surgery medicines to increase blood counts metronidazole This list may not describe all possible interactions. Give your health care provider a list of all the medicines, herbs, non-prescription drugs, or dietary supplements you use. Also tell them if you smoke, drink alcohol, or use illegal drugs. Some items may interact with your medicine. What should I watch for while using this medication? Your condition will be monitored carefully while you are receiving this medicine. You may need blood work done while you are taking this medicine. Drink water or other fluids as directed. Urinate often, even at night. Some products may contain alcohol. Ask your health care professional if this medicine contains alcohol. Be sure to tell all health care professionals you are taking this medicine. Certain medicines, like metronidazole and disulfiram, can cause an unpleasant reaction when taken with alcohol. The reaction includes flushing, headache, nausea, vomiting, sweating, and increased thirst. The reaction can last from 30 minutes to several hours. Do not become pregnant while taking this medicine or for 1 year after stopping it. Women should inform their health care professional if they wish to become pregnant or think they might be pregnant. Men should not father a child while taking this medicine and for 4 months after stopping it. There is potential for serious side effects to an unborn child. Talk to your health care professional for more information. Do not breast-feed an infant while taking this medicine or for 1 week after stopping it. This medicine has caused ovarian failure in some women. This medicine may make it more difficult to get pregnant. Talk to your health care professional if you are concerned  about your fertility. This medicine has caused decreased sperm counts in some men. This may make it more difficult to  father a child. Talk to your health care professional if you are concerned about your fertility. Call your health care professional for advice if you get a fever, chills, or sore throat, or other symptoms of a cold or flu. Do not treat yourself. This medicine decreases your body's ability to fight infections. Try to avoid being around people who are sick. Avoid taking medicines that contain aspirin, acetaminophen, ibuprofen, naproxen, or ketoprofen unless instructed by your health care professional. These medicines may hide a fever. Talk to your health care professional about your risk of cancer. You may be more at risk for certain types of cancer if you take this medicine. If you are going to need surgery or other procedure, tell your health care professional that you are using this medicine. Be careful brushing or flossing your teeth or using a toothpick because you may get an infection or bleed more easily. If you have any dental work done, tell your dentist you are receiving this medicine. What side effects may I notice from receiving this medication? Side effects that you should report to your doctor or health care professional as soon as possible: allergic reactions like skin rash, itching or hives, swelling of the face, lips, or tongue breathing problems nausea, vomiting signs and symptoms of bleeding such as bloody or black, tarry stools; red or dark brown urine; spitting up blood or brown material that looks like coffee grounds; red spots on the skin; unusual bruising or bleeding from the eyes, gums, or nose signs and symptoms of heart failure like fast, irregular heartbeat, sudden weight gain; swelling of the ankles, feet, hands signs and symptoms of infection like fever; chills; cough; sore throat; pain or trouble passing urine signs and symptoms of kidney injury like trouble passing urine or change in the amount of urine signs and symptoms of liver injury like dark yellow or brown urine;  general ill feeling or flu-like symptoms; light-colored stools; loss of appetite; nausea; right upper belly pain; unusually weak or tired; yellowing of the eyes or skin Side effects that usually do not require medical attention (report to your doctor or health care professional if they continue or are bothersome): confusion decreased hearing diarrhea facial flushing hair loss headache loss of appetite missed menstrual periods signs and symptoms of low red blood cells or anemia such as unusually weak or tired; feeling faint or lightheaded; falls skin discoloration This list may not describe all possible side effects. Call your doctor for medical advice about side effects. You may report side effects to FDA at 1-800-FDA-1088. Where should I keep my medication? This drug is given in a hospital or clinic and will not be stored at home. NOTE: This sheet is a summary. It may not cover all possible information. If you have questions about this medicine, talk to your doctor, pharmacist, or health care provider.  2022 Elsevier/Gold Standard (2021-03-07 00:00:00) Docetaxel injection What is this medication? DOCETAXEL (doe se TAX el) is a chemotherapy drug. It targets fast dividing cells, like cancer cells, and causes these cells to die. This medicine is used to treat many types of cancers like breast cancer, certain stomach cancers, head and neck cancer, lung cancer, and prostate cancer. This medicine may be used for other purposes; ask your health care provider or pharmacist if you have questions. COMMON BRAND NAME(S): Docefrez, Taxotere What should I tell my care  team before I take this medication? They need to know if you have any of these conditions: infection (especially a virus infection such as chickenpox, cold sores, or herpes) liver disease low blood counts, like low white cell, platelet, or red cell counts an unusual or allergic reaction to docetaxel, polysorbate 80, other chemotherapy  agents, other medicines, foods, dyes, or preservatives pregnant or trying to get pregnant breast-feeding How should I use this medication? This drug is given as an infusion into a vein. It is administered in a hospital or clinic by a specially trained health care professional. Talk to your pediatrician regarding the use of this medicine in children. Special care may be needed. Overdosage: If you think you have taken too much of this medicine contact a poison control center or emergency room at once. NOTE: This medicine is only for you. Do not share this medicine with others. What if I miss a dose? It is important not to miss your dose. Call your doctor or health care professional if you are unable to keep an appointment. What may interact with this medication? Do not take this medicine with any of the following medications: live virus vaccines This medicine may also interact with the following medications: aprepitant certain antibiotics like erythromycin or clarithromycin certain antivirals for HIV or hepatitis certain medicines for fungal infections like fluconazole, itraconazole, ketoconazole, posaconazole, or voriconazole cimetidine ciprofloxacin conivaptan cyclosporine dronedarone fluvoxamine grapefruit juice imatinib verapamil This list may not describe all possible interactions. Give your health care provider a list of all the medicines, herbs, non-prescription drugs, or dietary supplements you use. Also tell them if you smoke, drink alcohol, or use illegal drugs. Some items may interact with your medicine. What should I watch for while using this medication? Your condition will be monitored carefully while you are receiving this medicine. You will need important blood work done while you are taking this medicine. Call your doctor or health care professional for advice if you get a fever, chills or sore throat, or other symptoms of a cold or flu. Do not treat yourself. This drug  decreases your body's ability to fight infections. Try to avoid being around people who are sick. Some products may contain alcohol. Ask your health care professional if this medicine contains alcohol. Be sure to tell all health care professionals you are taking this medicine. Certain medicines, like metronidazole and disulfiram, can cause an unpleasant reaction when taken with alcohol. The reaction includes flushing, headache, nausea, vomiting, sweating, and increased thirst. The reaction can last from 30 minutes to several hours. You may get drowsy or dizzy. Do not drive, use machinery, or do anything that needs mental alertness until you know how this medicine affects you. Do not stand or sit up quickly, especially if you are an older patient. This reduces the risk of dizzy or fainting spells. Alcohol may interfere with the effect of this medicine. Talk to your health care professional about your risk of cancer. You may be more at risk for certain types of cancer if you take this medicine. Do not become pregnant while taking this medicine or for 6 months after stopping it. Women should inform their doctor if they wish to become pregnant or think they might be pregnant. There is a potential for serious side effects to an unborn child. Talk to your health care professional or pharmacist for more information. Do not breast-feed an infant while taking this medicine or for 1 week after stopping it. Males who get this medicine  must use a condom during sex with females who can get pregnant. If you get a woman pregnant, the baby could have birth defects. The baby could die before they are born. You will need to continue wearing a condom for 3 months after stopping the medicine. Tell your health care provider right away if your partner becomes pregnant while you are taking this medicine. This may interfere with the ability to father a child. You should talk to your doctor or health care professional if you are  concerned about your fertility. What side effects may I notice from receiving this medication? Side effects that you should report to your doctor or health care professional as soon as possible: allergic reactions like skin rash, itching or hives, swelling of the face, lips, or tongue blurred vision breathing problems changes in vision low blood counts - This drug may decrease the number of white blood cells, red blood cells and platelets. You may be at increased risk for infections and bleeding. nausea and vomiting pain, redness or irritation at site where injected pain, tingling, numbness in the hands or feet redness, blistering, peeling, or loosening of the skin, including inside the mouth signs of decreased platelets or bleeding - bruising, pinpoint red spots on the skin, black, tarry stools, nosebleeds signs of decreased red blood cells - unusually weak or tired, fainting spells, lightheadedness signs of infection - fever or chills, cough, sore throat, pain or difficulty passing urine swelling of the ankle, feet, hands Side effects that usually do not require medical attention (report to your doctor or health care professional if they continue or are bothersome): constipation diarrhea fingernail or toenail changes hair loss loss of appetite mouth sores muscle pain This list may not describe all possible side effects. Call your doctor for medical advice about side effects. You may report side effects to FDA at 1-800-FDA-1088. Where should I keep my medication? This drug is given in a hospital or clinic and will not be stored at home. NOTE: This sheet is a summary. It may not cover all possible information. If you have questions about this medicine, talk to your doctor, pharmacist, or health care provider.  2022 Elsevier/Gold Standard (2021-03-07 00:00:00)

## 2021-07-25 ENCOUNTER — Telehealth: Payer: Self-pay

## 2021-07-25 NOTE — Telephone Encounter (Signed)
Pt states, "I'm doing well". Pt denies N/V, rashes, diarrhea and fever. She slept well last night. Pt encouraged to call us anytime she has questions or concerns. I reminded her of the importance of calling us if develops a temp of 100.4 Pt verbalized understanding.

## 2021-07-26 ENCOUNTER — Inpatient Hospital Stay: Payer: Medicare Other

## 2021-07-26 ENCOUNTER — Other Ambulatory Visit: Payer: Self-pay

## 2021-07-26 VITALS — BP 137/85 | HR 92 | Resp 18 | Ht 62.0 in | Wt 196.0 lb

## 2021-07-26 DIAGNOSIS — C50911 Malignant neoplasm of unspecified site of right female breast: Secondary | ICD-10-CM | POA: Diagnosis not present

## 2021-07-26 DIAGNOSIS — Z170 Estrogen receptor positive status [ER+]: Secondary | ICD-10-CM

## 2021-07-26 MED ORDER — PEGFILGRASTIM-BMEZ 6 MG/0.6ML ~~LOC~~ SOSY
6.0000 mg | PREFILLED_SYRINGE | Freq: Once | SUBCUTANEOUS | Status: AC
  Administered 2021-07-26: 6 mg via SUBCUTANEOUS
  Filled 2021-07-26: qty 0.6

## 2021-07-26 NOTE — Patient Instructions (Signed)

## 2021-07-28 ENCOUNTER — Other Ambulatory Visit: Payer: Self-pay

## 2021-07-28 ENCOUNTER — Encounter: Payer: Self-pay | Admitting: Oncology

## 2021-07-31 NOTE — Progress Notes (Cosign Needed)
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 She completed cycle 1 of TC fairly well. She did note severe fatigue 5-6 days post treatment, which I assured her is normal and advised her to remember the timing for future cycles. She will keep scheduled follow up with Dr. Bobby Rumpf    The patient understands the plans discussed today and is in agreement with them.  She knows to contact our office if she develops concerns prior to her next appointment.    Melodye Ped, NP  [facility] [address] [place] [address] Dept: [phone] Dept Fax: [phone]   Orders Placed This Encounter  Procedures   CBC and differential    This external order was created through the Results Console.   CBC    This external order was created through the Results Console.   Basic metabolic panel    This external order was created through the Results Console.   Comprehensive metabolic panel    This external order was created through the Results Console.   Hepatic function panel    This external order was created through the Results Console.      CHIEF COMPLAINT:  CC: A 61 year old female with history of breast cancer here for nadir visit following her first chemotherapy treatment  Current Treatment:  Doxetaxel/ Cyclophosphamide  INTERVAL HISTORY:  Kathleen Sherman is here today for repeat clinical assessment. She denies fevers or chills. She denies pain. Her appetite is good. Her weight has been stable. She  denies nausea or vomiting. She denies constipation or diarrhea.  I have reviewed the past medical history, past surgical history, social history and family history with the patient and they are unchanged from previous note.  ALLERGIES:  has No Known Allergies.  MEDICATIONS:  Current Outpatient Medications  Medication Sig Dispense Refill   ALPRAZolam (XANAX) 0.5 MG tablet Take 0.5 mg by mouth 2 (two) times daily as needed for anxiety (may use up to 1 mg prn).     amitriptyline (ELAVIL) 50 MG tablet Take 50 mg by mouth at bedtime.     ascorbic acid (VITAMIN C) 1000 MG tablet Take 1,000 mg by mouth daily.     atorvastatin (LIPITOR) 10 MG tablet Take 10 mg by mouth 3 (three) times a week.     dexamethasone (DECADRON) 4 MG tablet Take 2 tablets (8 mg total) by mouth 2 (two) times daily. Start the day before Taxotere. Then again the day after chemo for 3 days. 30 tablet 1   metFORMIN (GLUCOPHAGE-XR) 500 MG 24 hr tablet Take 500 mg by mouth daily.     ondansetron (ZOFRAN) 4 MG tablet Take 1 tablet (4 mg total) by mouth every 4 (four) hours as needed for nausea. 90 tablet 3   prochlorperazine (COMPAZINE) 10 MG tablet Take 1 tablet (10 mg total) by mouth every 6 (six) hours as needed (Nausea or vomiting). 30 tablet 1   No current facility-administered medications for this visit.    HISTORY OF PRESENT ILLNESS:   Oncology History  Breast cancer ([facility])  07/11/2021 Initial Diagnosis   Breast cancer ([facility])   07/24/2021 -  Chemotherapy   Patient is on Treatment Plan : BREAST TC q21d         REVIEW OF SYSTEMS:   Constitutional: Denies fevers, chills or abnormal weight loss Eyes: Denies blurriness of vision Ears, nose, mouth, throat, and face: Denies mucositis or sore throat Respiratory: Denies cough, dyspnea or wheezes Cardiovascular: Denies palpitation, chest discomfort or lower extremity swelling Gastrointestinal:  Denies nausea, heartburn or change in bowel habits Skin: Denies abnormal skin  rashes Lymphatics: Denies new lymphadenopathy or easy bruising Neurological:Denies numbness, tingling or new weaknesses Behavioral/Psych: Mood is stable, no new changes  All other systems were reviewed with the patient and are negative.   VITALS:  Blood pressure 123/81, pulse (!) 114, temperature 98.4 F (36.9 C), temperature source Oral, resp. rate 20, height 5\' 2"  (1.575 m), weight 191 lb (86.6 kg), SpO2 94 %.  Wt Readings from Last 3 Encounters:  08/01/21 191 lb (86.6 kg)  07/26/21 196 lb (88.9 kg)  07/24/21 198 lb (89.8 kg)    Body mass index is 34.93 kg/m.  Performance status (ECOG): 1 - Symptomatic but completely ambulatory  PHYSICAL EXAM:   GENERAL:alert, no distress and comfortable SKIN: skin color, texture, turgor are normal, no rashes or significant lesions EYES: normal, Conjunctiva are pink and non-injected, sclera clear OROPHARYNX:no exudate, no erythema and lips, buccal mucosa, and tongue normal  NECK: supple, thyroid normal size, non-tender, without nodularity LYMPH:  no palpable lymphadenopathy in the cervical, axillary or inguinal LUNGS: clear to auscultation and percussion with normal breathing effort HEART: regular rate & rhythm and no murmurs and no lower extremity edema ABDOMEN:abdomen soft, non-tender and normal bowel sounds Musculoskeletal:no cyanosis of digits and no clubbing  NEURO: alert & oriented x 3 with fluent speech, no focal motor/sensory deficits  LABORATORY DATA:  I have reviewed the data as listed    Component Value Date/Time   NA 131 (A) 08/01/2021 0000   K 3.2 (A) 08/01/2021 0000   CL 96 (A) 08/01/2021 0000   CO2 29 (A) 08/01/2021 0000   BUN 6 08/01/2021 0000   CREATININE 0.6 08/01/2021 0000   CALCIUM 8.2 (A) 08/01/2021 0000   ALBUMIN 3.4 (A) 08/01/2021 0000   AST 28 08/01/2021 0000   ALT 23 08/01/2021 0000   ALKPHOS 113 08/01/2021 0000    No results found for: SPEP, UPEP  Lab Results  Component Value Date   WBC 15.9  08/01/2021   NEUTROABS 12.88 08/01/2021   HGB 14.2 08/01/2021   HCT 42 08/01/2021   MCV 97 07/21/2021   PLT 162 08/01/2021      Chemistry      Component Value Date/Time   NA 131 (A) 08/01/2021 0000   K 3.2 (A) 08/01/2021 0000   CL 96 (A) 08/01/2021 0000   CO2 29 (A) 08/01/2021 0000   BUN 6 08/01/2021 0000   CREATININE 0.6 08/01/2021 0000   GLU 121 08/01/2021 0000      Component Value Date/Time   CALCIUM 8.2 (A) 08/01/2021 0000   ALKPHOS 113 08/01/2021 0000   AST 28 08/01/2021 0000   ALT 23 08/01/2021 0000       RADIOGRAPHIC STUDIES: I have personally reviewed the radiological images as listed and agreed with the findings in the report. No results found.

## 2021-08-01 ENCOUNTER — Encounter: Payer: Self-pay | Admitting: Hematology and Oncology

## 2021-08-01 ENCOUNTER — Inpatient Hospital Stay: Payer: Medicare Other

## 2021-08-01 ENCOUNTER — Other Ambulatory Visit: Payer: Self-pay

## 2021-08-01 ENCOUNTER — Ambulatory Visit: Payer: Medicare Other | Admitting: Oncology

## 2021-08-01 ENCOUNTER — Inpatient Hospital Stay (INDEPENDENT_AMBULATORY_CARE_PROVIDER_SITE_OTHER): Payer: Medicare Other | Admitting: Hematology and Oncology

## 2021-08-01 DIAGNOSIS — Z170 Estrogen receptor positive status [ER+]: Secondary | ICD-10-CM | POA: Diagnosis not present

## 2021-08-01 DIAGNOSIS — C50411 Malignant neoplasm of upper-outer quadrant of right female breast: Secondary | ICD-10-CM

## 2021-08-01 LAB — CBC AND DIFFERENTIAL
HCT: 42 (ref 36–46)
Hemoglobin: 14.2 (ref 12.0–16.0)
Neutrophils Absolute: 12.88
Platelets: 162 (ref 150–399)
WBC: 15.9

## 2021-08-01 LAB — BASIC METABOLIC PANEL
BUN: 6 (ref 4–21)
CO2: 29 — AB (ref 13–22)
Chloride: 96 — AB (ref 99–108)
Creatinine: 0.6 (ref 0.5–1.1)
Glucose: 121
Potassium: 3.2 — AB (ref 3.4–5.3)
Sodium: 131 — AB (ref 137–147)

## 2021-08-01 LAB — COMPREHENSIVE METABOLIC PANEL
Albumin: 3.4 — AB (ref 3.5–5.0)
Calcium: 8.2 — AB (ref 8.7–10.7)

## 2021-08-01 LAB — HEPATIC FUNCTION PANEL
ALT: 23 (ref 7–35)
AST: 28 (ref 13–35)
Alkaline Phosphatase: 113 (ref 25–125)
Bilirubin, Total: 0.8

## 2021-08-01 LAB — CBC: RBC: 4.43 (ref 3.87–5.11)

## 2021-08-01 NOTE — Assessment & Plan Note (Addendum)
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 She completed cycle 1 of TC fairly well. She did note severe fatigue 5-6 days post treatment, which I assured her is normal and advised her to remember the timing for future cycles. She will keep scheduled follow up with Dr. Bobby Rumpf

## 2021-08-03 ENCOUNTER — Telehealth: Payer: Self-pay | Admitting: Dietician

## 2021-08-03 NOTE — Telephone Encounter (Signed)
Nutrition Assessment   Reason for Assessment: MST screen for weight loss.    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  She is being followed by Dr. Bobby Rumpf. She has a PMHx that includes cervical cancer, GERD, COPD, HLD, DDD, hemochromatosis, asthma, and tobacco use.  Trouble with foods not tasting right, lack of appetite.  PO intake used to be 2 meal a deal pattern. Usual intake used to be "little something in the morning like sausage & grits, or breakfast sandwich and supper." She reports she does most of the cooking but spouse is a better cook and he's very supportive.Yesterday she reports she only had a little bit of fruit (watermelon) and toasted peanut butter sandwich.  Drinking mostly water and some tea, coffee in morning but it isn't tasting very good now. (Water 3 16 oz bottles yesterday)   Anthropometrics: lost 7.5#, 3.78# past 2 weeks  Height: 62" Weight: 191#  07/12/21 198# 05/03/21 200#  BMI: 34.93  INTERVENTION: Reviewed goal of weight stability and including healthy options. Reviewed role of macro nutrients in health and discussed strategies to overcome her lack of appetite. Encouraged small frequent feeds and high calorie high protein choices. Emailed contact information with nutrition tips for poor appetite and high protein high calorie suggestions. Encouraged her to reach out if she continued to struggle with weight maintenance.   MONITORING, EVALUATION, GOAL: weight trends, PO intake   Next Visit: PRN at patient or provider request  April Manson, RDN, LDN Registered Dietitian, [place] Part Time Remote (Usual office hours: Tuesday-Thursday) Cell: [phone]

## 2021-08-07 ENCOUNTER — Encounter: Payer: Self-pay | Admitting: Oncology

## 2021-08-07 NOTE — Progress Notes (Signed)
°  [facility]  [address] [place],  [place]  [address] [phone]  Clinic Day:  08/11/2021  Referring physician: Laverle Hobby, NP  This document serves as a record of services personally performed by Marice Potter, MD. It was created on their behalf by Curry,Lauren E, a trained medical scribe. The creation of this record is based on the scribe's personal observations and the provider's statements to them.  HISTORY OF PRESENT ILLNESS:  The patient is a 61 y.o. 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  She comes in today to be evaluated before heading into her second cycle of adjuvant Taxotere/Cytoxan.  Since her last visit, the patient has been doing okay.  She did have a few days of weakness approximately 1 week after her first cycle of chemotherapy.  Otherwise she has been doing fairly well.    LABS:   CBC Latest Ref Rng & Units 08/11/2021 08/01/2021 07/21/2021  WBC - 5.9 15.9 6.2  Hemoglobin 12.0 - 16.0 12.3 14.2 14.1  Hematocrit 36 - 46 36 42 43  Platelets 150 - 399 358 162 239   CMP Latest Ref Rng & Units 08/11/2021 08/01/2021 07/21/2021  BUN 4 - 21 5 6 11   Creatinine 0.5 - 1.1 0.5 0.6 0.7  Sodium 137 - 147 135(A) 131(A) 138  Potassium 3.4 - 5.3 3.6 3.2(A) 4.0  Chloride 99 - 108 103 96(A) 103  CO2 13 - 22 27(A) 29(A) 28(A)  Calcium 8.7 - 10.7 8.5(A) 8.2(A) 8.7  Alkaline Phos 25 - 125 87 113 94  AST 13 - 35 31 28 26   ALT 7 - 35 23 23 25    ASSESSMENT & PLAN:  A 61 y.o. 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  She will proceed with her 2nd cycle of Taxotere/Cytoxan early next week.  She will continue to receive Neulasta to prevent severe neutropenia from delaying successive cycles of chemotherapy.  I will see her back in 3 weeks before she heads into her  3rd cycle of adjuvant Taxotere/Cytoxan.  The patient understands all the plans discussed today and is in agreement with them.     I, Rita Ohara, am acting as scribe for Marice Potter, MD    I have reviewed this report as typed by the medical scribe, and it is complete and accurate.  Farryn Linares Macarthur Critchley, MD

## 2021-08-11 ENCOUNTER — Telehealth: Payer: Self-pay | Admitting: Oncology

## 2021-08-11 ENCOUNTER — Inpatient Hospital Stay: Payer: Medicare Other

## 2021-08-11 ENCOUNTER — Other Ambulatory Visit: Payer: Self-pay

## 2021-08-11 ENCOUNTER — Inpatient Hospital Stay: Payer: Medicare Other | Attending: Oncology | Admitting: Oncology

## 2021-08-11 ENCOUNTER — Encounter: Payer: Self-pay | Admitting: Oncology

## 2021-08-11 ENCOUNTER — Other Ambulatory Visit: Payer: Self-pay | Admitting: Hematology and Oncology

## 2021-08-11 VITALS — BP 112/72 | HR 100 | Temp 97.8°F | Resp 18 | Wt 194.0 lb

## 2021-08-11 DIAGNOSIS — Z5111 Encounter for antineoplastic chemotherapy: Secondary | ICD-10-CM | POA: Insufficient documentation

## 2021-08-11 DIAGNOSIS — Z170 Estrogen receptor positive status [ER+]: Secondary | ICD-10-CM | POA: Diagnosis not present

## 2021-08-11 DIAGNOSIS — C50411 Malignant neoplasm of upper-outer quadrant of right female breast: Secondary | ICD-10-CM

## 2021-08-11 DIAGNOSIS — Z5189 Encounter for other specified aftercare: Secondary | ICD-10-CM | POA: Insufficient documentation

## 2021-08-11 LAB — HEPATIC FUNCTION PANEL
ALT: 23 (ref 7–35)
AST: 31 (ref 13–35)
Alkaline Phosphatase: 87 (ref 25–125)
Bilirubin, Total: 0.4

## 2021-08-11 LAB — BASIC METABOLIC PANEL
BUN: 5 (ref 4–21)
CO2: 27 — AB (ref 13–22)
Chloride: 103 (ref 99–108)
Creatinine: 0.5 (ref 0.5–1.1)
Glucose: 187
Potassium: 3.6 (ref 3.4–5.3)
Sodium: 135 — AB (ref 137–147)

## 2021-08-11 LAB — COMPREHENSIVE METABOLIC PANEL
Albumin: 3 — AB (ref 3.5–5.0)
Calcium: 8.5 — AB (ref 8.7–10.7)

## 2021-08-11 LAB — CBC: RBC: 3.75 — AB (ref 3.87–5.11)

## 2021-08-11 LAB — CBC AND DIFFERENTIAL
HCT: 36 (ref 36–46)
Hemoglobin: 12.3 (ref 12.0–16.0)
Neutrophils Absolute: 3.6
Platelets: 358 (ref 150–399)
WBC: 5.9

## 2021-08-11 MED FILL — Docetaxel Soln for IV Infusion 160 MG/16ML: INTRAVENOUS | Qty: 15 | Status: AC

## 2021-08-11 MED FILL — Cyclophosphamide For Inj 1 GM: INTRAMUSCULAR | Qty: 59 | Status: AC

## 2021-08-11 NOTE — Progress Notes (Signed)
Patient here for oncology follow-up appointment, expresses no complaints or concerns at this time.    

## 2021-08-11 NOTE — Telephone Encounter (Signed)
Per 08/11/21 los next appt scheduled and confirmed with patient °

## 2021-08-11 NOTE — Progress Notes (Signed)
Face to face with pt. In [place]. Pt has completed first chemo. Reports that her hair is already coming out. We discussed buzzing the hair to give her some control over the  situation. Pt reports a couple of rough days with very little energy but reports that she is doing better now.

## 2021-08-14 ENCOUNTER — Other Ambulatory Visit: Payer: Self-pay

## 2021-08-14 ENCOUNTER — Inpatient Hospital Stay (INDEPENDENT_AMBULATORY_CARE_PROVIDER_SITE_OTHER): Payer: Medicare Other

## 2021-08-14 ENCOUNTER — Encounter: Payer: Self-pay | Admitting: Oncology

## 2021-08-14 VITALS — BP 141/84 | HR 100 | Temp 97.9°F | Resp 20 | Ht 62.0 in | Wt 193.8 lb

## 2021-08-14 DIAGNOSIS — Z170 Estrogen receptor positive status [ER+]: Secondary | ICD-10-CM

## 2021-08-14 DIAGNOSIS — Z5189 Encounter for other specified aftercare: Secondary | ICD-10-CM | POA: Diagnosis not present

## 2021-08-14 DIAGNOSIS — C50411 Malignant neoplasm of upper-outer quadrant of right female breast: Secondary | ICD-10-CM | POA: Diagnosis not present

## 2021-08-14 DIAGNOSIS — Z5111 Encounter for antineoplastic chemotherapy: Secondary | ICD-10-CM | POA: Diagnosis present

## 2021-08-14 MED ORDER — SODIUM CHLORIDE 0.9 % IV SOLN
10.0000 mg | Freq: Once | INTRAVENOUS | Status: AC
  Administered 2021-08-14: 10 mg via INTRAVENOUS
  Filled 2021-08-14: qty 1

## 2021-08-14 MED ORDER — SODIUM CHLORIDE 0.9 % IV SOLN: Freq: Once | INTRAVENOUS | Status: AC

## 2021-08-14 MED ORDER — SODIUM CHLORIDE 0.9 % IV SOLN
75.0000 mg/m2 | Freq: Once | INTRAVENOUS | Status: AC
  Administered 2021-08-14: 150 mg via INTRAVENOUS
  Filled 2021-08-14: qty 15

## 2021-08-14 MED ORDER — SODIUM CHLORIDE 0.9% FLUSH
10.0000 mL | INTRAVENOUS | Status: DC | PRN
  Administered 2021-08-14: 10 mL

## 2021-08-14 MED ORDER — HEPARIN SOD (PORK) LOCK FLUSH 100 UNIT/ML IV SOLN
500.0000 [IU] | Freq: Once | INTRAVENOUS | Status: AC | PRN
  Administered 2021-08-14: 500 [IU]

## 2021-08-14 MED ORDER — SODIUM CHLORIDE 0.9 % IV SOLN
600.0000 mg/m2 | Freq: Once | INTRAVENOUS | Status: AC
  Administered 2021-08-14: 1180 mg via INTRAVENOUS
  Filled 2021-08-14: qty 59

## 2021-08-14 MED ORDER — PALONOSETRON HCL INJECTION 0.25 MG/5ML
0.2500 mg | Freq: Once | INTRAVENOUS | Status: AC
  Administered 2021-08-14: 0.25 mg via INTRAVENOUS
  Filled 2021-08-14: qty 5

## 2021-08-14 NOTE — Patient Instructions (Signed)
[facility]  Discharge Instructions: Thank you for choosing [facility] to provide your oncology and hematology care.  If you have a lab appointment with the [facility], please go directly to the [facility] and check in at the registration area.   Wear comfortable clothing and clothing appropriate for easy access to any Portacath or PICC line.   We strive to give you quality time with your provider. You may need to reschedule your appointment if you arrive late (15 or more minutes).  Arriving late affects you and other patients whose appointments are after yours.  Also, if you miss three or more appointments without notifying the office, you may be dismissed from the clinic at the providers discretion.      For prescription refill requests, have your pharmacy contact our office and allow 72 hours for refills to be completed.    Today you received the following chemotherapy and/or immunotherapy agents: Taxotere, Cytoxan       To help prevent nausea and vomiting after your treatment, we encourage you to take your nausea medication as directed.  BELOW ARE SYMPTOMS THAT SHOULD BE REPORTED IMMEDIATELY: *FEVER GREATER THAN 100.4 F (38 C) OR HIGHER *CHILLS OR SWEATING *NAUSEA AND VOMITING THAT IS NOT CONTROLLED WITH YOUR NAUSEA MEDICATION *UNUSUAL SHORTNESS OF BREATH *UNUSUAL BRUISING OR BLEEDING *URINARY PROBLEMS (pain or burning when urinating, or frequent urination) *BOWEL PROBLEMS (unusual diarrhea, constipation, pain near the anus) TENDERNESS IN MOUTH AND THROAT WITH OR WITHOUT PRESENCE OF ULCERS (sore throat, sores in mouth, or a toothache) UNUSUAL RASH, SWELLING OR PAIN  UNUSUAL VAGINAL DISCHARGE OR ITCHING   Items with * indicate a potential emergency and should be followed up as soon as possible or go to the Emergency Department if any problems should occur.  Please show the CHEMOTHERAPY ALERT CARD or IMMUNOTHERAPY ALERT CARD at check-in  to the Emergency Department and triage nurse.  Should you have questions after your visit or need to cancel or reschedule your appointment, please contact [facility]  Dept: [phone]  and follow the prompts.  Office hours are 8:00 a.m. to 4:30 p.m. Monday - Friday. Please note that voicemails left after 4:00 p.m. may not be returned until the following business day.  We are closed weekends and major holidays. You have access to a nurse at all times for urgent questions. Please call the main number to the clinic Dept: [phone] and follow the prompts.  For any non-urgent questions, you may also contact your provider using MyChart. We now offer e-Visits for anyone 19 and older to request care online for non-urgent symptoms. For details visit [email or web address].   Also download the MyChart app! Go to the app store, search "MyChart", open the app, select [facility], and log in with your MyChart username and password.  Due to Covid, a mask is required upon entering the hospital/clinic. If you do not have a mask, one will be given to you upon arrival. For doctor visits, patients may have 1 support person aged 35 or older with them. For treatment visits, patients cannot have anyone with them due to current Covid guidelines and our immunocompromised population.   Cyclophosphamide Injection What is this medication? CYCLOPHOSPHAMIDE (sye kloe FOSS fa mide) is a chemotherapy drug. It slows the growth of cancer cells. This medicine is used to treat many types of cancer like lymphoma, myeloma, leukemia, breast cancer, and ovarian cancer, to name a few. This medicine may  be used for other purposes; ask your health care provider or pharmacist if you have questions. COMMON BRAND NAME(S): Cytoxan, Neosar What should I tell my care team before I take this medication? They need to know if you have any of these conditions: heart disease history of irregular  heartbeat infection kidney disease liver disease low blood counts, like white cells, platelets, or red blood cells on hemodialysis recent or ongoing radiation therapy scarring or thickening of the lungs trouble passing urine an unusual or allergic reaction to cyclophosphamide, other medicines, foods, dyes, or preservatives pregnant or trying to get pregnant breast-feeding How should I use this medication? This drug is usually given as an injection into a vein or muscle or by infusion into a vein. It is administered in a hospital or clinic by a specially trained health care professional. Talk to your pediatrician regarding the use of this medicine in children. Special care may be needed. Overdosage: If you think you have taken too much of this medicine contact a poison control center or emergency room at once. NOTE: This medicine is only for you. Do not share this medicine with others. What if I miss a dose? It is important not to miss your dose. Call your doctor or health care professional if you are unable to keep an appointment. What may interact with this medication? amphotericin B azathioprine certain antivirals for HIV or hepatitis certain medicines for blood pressure, heart disease, irregular heart beat certain medicines that treat or prevent blood clots like warfarin certain other medicines for cancer cyclosporine etanercept indomethacin medicines that relax muscles for surgery medicines to increase blood counts metronidazole This list may not describe all possible interactions. Give your health care provider a list of all the medicines, herbs, non-prescription drugs, or dietary supplements you use. Also tell them if you smoke, drink alcohol, or use illegal drugs. Some items may interact with your medicine. What should I watch for while using this medication? Your condition will be monitored carefully while you are receiving this medicine. You may need blood work done while  you are taking this medicine. Drink water or other fluids as directed. Urinate often, even at night. Some products may contain alcohol. Ask your health care professional if this medicine contains alcohol. Be sure to tell all health care professionals you are taking this medicine. Certain medicines, like metronidazole and disulfiram, can cause an unpleasant reaction when taken with alcohol. The reaction includes flushing, headache, nausea, vomiting, sweating, and increased thirst. The reaction can last from 30 minutes to several hours. Do not become pregnant while taking this medicine or for 1 year after stopping it. Women should inform their health care professional if they wish to become pregnant or think they might be pregnant. Men should not father a child while taking this medicine and for 4 months after stopping it. There is potential for serious side effects to an unborn child. Talk to your health care professional for more information. Do not breast-feed an infant while taking this medicine or for 1 week after stopping it. This medicine has caused ovarian failure in some women. This medicine may make it more difficult to get pregnant. Talk to your health care professional if you are concerned about your fertility. This medicine has caused decreased sperm counts in some men. This may make it more difficult to father a child. Talk to your health care professional if you are concerned about your fertility. Call your health care professional for advice if you get a fever,  chills, or sore throat, or other symptoms of a cold or flu. Do not treat yourself. This medicine decreases your body's ability to fight infections. Try to avoid being around people who are sick. Avoid taking medicines that contain aspirin, acetaminophen, ibuprofen, naproxen, or ketoprofen unless instructed by your health care professional. These medicines may hide a fever. Talk to your health care professional about your risk of cancer.  You may be more at risk for certain types of cancer if you take this medicine. If you are going to need surgery or other procedure, tell your health care professional that you are using this medicine. Be careful brushing or flossing your teeth or using a toothpick because you may get an infection or bleed more easily. If you have any dental work done, tell your dentist you are receiving this medicine. What side effects may I notice from receiving this medication? Side effects that you should report to your doctor or health care professional as soon as possible: allergic reactions like skin rash, itching or hives, swelling of the face, lips, or tongue breathing problems nausea, vomiting signs and symptoms of bleeding such as bloody or black, tarry stools; red or dark brown urine; spitting up blood or brown material that looks like coffee grounds; red spots on the skin; unusual bruising or bleeding from the eyes, gums, or nose signs and symptoms of heart failure like fast, irregular heartbeat, sudden weight gain; swelling of the ankles, feet, hands signs and symptoms of infection like fever; chills; cough; sore throat; pain or trouble passing urine signs and symptoms of kidney injury like trouble passing urine or change in the amount of urine signs and symptoms of liver injury like dark yellow or brown urine; general ill feeling or flu-like symptoms; light-colored stools; loss of appetite; nausea; right upper belly pain; unusually weak or tired; yellowing of the eyes or skin Side effects that usually do not require medical attention (report to your doctor or health care professional if they continue or are bothersome): confusion decreased hearing diarrhea facial flushing hair loss headache loss of appetite missed menstrual periods signs and symptoms of low red blood cells or anemia such as unusually weak or tired; feeling faint or lightheaded; falls skin discoloration This list may not describe  all possible side effects. Call your doctor for medical advice about side effects. You may report side effects to FDA at 1-800-FDA-1088. Where should I keep my medication? This drug is given in a hospital or clinic and will not be stored at home. NOTE: This sheet is a summary. It may not cover all possible information. If you have questions about this medicine, talk to your doctor, pharmacist, or health care provider.  2022 Elsevier/Gold Standard (2021-03-07 00:00:00) Docetaxel injection What is this medication? DOCETAXEL (doe se TAX el) is a chemotherapy drug. It targets fast dividing cells, like cancer cells, and causes these cells to die. This medicine is used to treat many types of cancers like breast cancer, certain stomach cancers, head and neck cancer, lung cancer, and prostate cancer. This medicine may be used for other purposes; ask your health care provider or pharmacist if you have questions. COMMON BRAND NAME(S): Docefrez, Taxotere What should I tell my care team before I take this medication? They need to know if you have any of these conditions: infection (especially a virus infection such as chickenpox, cold sores, or herpes) liver disease low blood counts, like low white cell, platelet, or red cell counts an unusual or allergic reaction  to docetaxel, polysorbate 80, other chemotherapy agents, other medicines, foods, dyes, or preservatives pregnant or trying to get pregnant breast-feeding How should I use this medication? This drug is given as an infusion into a vein. It is administered in a hospital or clinic by a specially trained health care professional. Talk to your pediatrician regarding the use of this medicine in children. Special care may be needed. Overdosage: If you think you have taken too much of this medicine contact a poison control center or emergency room at once. NOTE: This medicine is only for you. Do not share this medicine with others. What if I miss a  dose? It is important not to miss your dose. Call your doctor or health care professional if you are unable to keep an appointment. What may interact with this medication? Do not take this medicine with any of the following medications: live virus vaccines This medicine may also interact with the following medications: aprepitant certain antibiotics like erythromycin or clarithromycin certain antivirals for HIV or hepatitis certain medicines for fungal infections like fluconazole, itraconazole, ketoconazole, posaconazole, or voriconazole cimetidine ciprofloxacin conivaptan cyclosporine dronedarone fluvoxamine grapefruit juice imatinib verapamil This list may not describe all possible interactions. Give your health care provider a list of all the medicines, herbs, non-prescription drugs, or dietary supplements you use. Also tell them if you smoke, drink alcohol, or use illegal drugs. Some items may interact with your medicine. What should I watch for while using this medication? Your condition will be monitored carefully while you are receiving this medicine. You will need important blood work done while you are taking this medicine. Call your doctor or health care professional for advice if you get a fever, chills or sore throat, or other symptoms of a cold or flu. Do not treat yourself. This drug decreases your body's ability to fight infections. Try to avoid being around people who are sick. Some products may contain alcohol. Ask your health care professional if this medicine contains alcohol. Be sure to tell all health care professionals you are taking this medicine. Certain medicines, like metronidazole and disulfiram, can cause an unpleasant reaction when taken with alcohol. The reaction includes flushing, headache, nausea, vomiting, sweating, and increased thirst. The reaction can last from 30 minutes to several hours. You may get drowsy or dizzy. Do not drive, use machinery, or do  anything that needs mental alertness until you know how this medicine affects you. Do not stand or sit up quickly, especially if you are an older patient. This reduces the risk of dizzy or fainting spells. Alcohol may interfere with the effect of this medicine. Talk to your health care professional about your risk of cancer. You may be more at risk for certain types of cancer if you take this medicine. Do not become pregnant while taking this medicine or for 6 months after stopping it. Women should inform their doctor if they wish to become pregnant or think they might be pregnant. There is a potential for serious side effects to an unborn child. Talk to your health care professional or pharmacist for more information. Do not breast-feed an infant while taking this medicine or for 1 week after stopping it. Males who get this medicine must use a condom during sex with females who can get pregnant. If you get a woman pregnant, the baby could have birth defects. The baby could die before they are born. You will need to continue wearing a condom for 3 months after stopping the medicine. Tell  your health care provider right away if your partner becomes pregnant while you are taking this medicine. This may interfere with the ability to father a child. You should talk to your doctor or health care professional if you are concerned about your fertility. What side effects may I notice from receiving this medication? Side effects that you should report to your doctor or health care professional as soon as possible: allergic reactions like skin rash, itching or hives, swelling of the face, lips, or tongue blurred vision breathing problems changes in vision low blood counts - This drug may decrease the number of white blood cells, red blood cells and platelets. You may be at increased risk for infections and bleeding. nausea and vomiting pain, redness or irritation at site where injected pain, tingling, numbness  in the hands or feet redness, blistering, peeling, or loosening of the skin, including inside the mouth signs of decreased platelets or bleeding - bruising, pinpoint red spots on the skin, black, tarry stools, nosebleeds signs of decreased red blood cells - unusually weak or tired, fainting spells, lightheadedness signs of infection - fever or chills, cough, sore throat, pain or difficulty passing urine swelling of the ankle, feet, hands Side effects that usually do not require medical attention (report to your doctor or health care professional if they continue or are bothersome): constipation diarrhea fingernail or toenail changes hair loss loss of appetite mouth sores muscle pain This list may not describe all possible side effects. Call your doctor for medical advice about side effects. You may report side effects to FDA at 1-800-FDA-1088. Where should I keep my medication? This drug is given in a hospital or clinic and will not be stored at home. NOTE: This sheet is a summary. It may not cover all possible information. If you have questions about this medicine, talk to your doctor, pharmacist, or health care provider.  2022 Elsevier/Gold Standard (2021-03-07 00:00:00)

## 2021-08-16 ENCOUNTER — Inpatient Hospital Stay: Payer: Medicare Other

## 2021-08-16 ENCOUNTER — Other Ambulatory Visit: Payer: Self-pay

## 2021-08-16 VITALS — BP 111/78 | HR 121 | Temp 97.5°F | Resp 18 | Ht 62.0 in | Wt 191.8 lb

## 2021-08-16 DIAGNOSIS — C50411 Malignant neoplasm of upper-outer quadrant of right female breast: Secondary | ICD-10-CM

## 2021-08-16 DIAGNOSIS — Z5111 Encounter for antineoplastic chemotherapy: Secondary | ICD-10-CM | POA: Diagnosis not present

## 2021-08-16 DIAGNOSIS — Z170 Estrogen receptor positive status [ER+]: Secondary | ICD-10-CM

## 2021-08-16 MED ORDER — PEGFILGRASTIM-BMEZ 6 MG/0.6ML ~~LOC~~ SOSY
6.0000 mg | PREFILLED_SYRINGE | Freq: Once | SUBCUTANEOUS | Status: AC
  Administered 2021-08-16: 6 mg via SUBCUTANEOUS
  Filled 2021-08-16: qty 0.6

## 2021-08-16 NOTE — Patient Instructions (Signed)

## 2021-08-17 ENCOUNTER — Other Ambulatory Visit: Payer: Self-pay

## 2021-08-24 ENCOUNTER — Other Ambulatory Visit: Payer: Self-pay | Admitting: Oncology

## 2021-08-28 NOTE — Progress Notes (Incomplete)
°  [facility]  [address] [place],  [place]  [address] [phone]  Clinic Day:  09/01/2021  Referring physician: Laverle Hobby, NP  This document serves as a record of services personally performed by Marice Potter, MD. It was created on their behalf by Curry,Lauren E, a trained medical scribe. The creation of this record is based on the scribe's personal observations and the provider's statements to them.  HISTORY OF PRESENT ILLNESS:  The patient is a 61 y.o. 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  She comes in today to be evaluated before heading into her 3rd cycle of adjuvant Taxotere/Cytoxan.  Since her last visit, the patient has been doing okay.  She did have a few days of weakness approximately 1 week after her first cycle of chemotherapy.  Otherwise she has been doing fairly well.    LABS:   CBC Latest Ref Rng & Units 09/01/2021 08/11/2021 08/01/2021  WBC - 6.6 5.9 15.9  Hemoglobin 12.0 - 16.0 12.9 12.3 14.2  Hematocrit 36 - 46 39 36 42  Platelets 150 - 399 182 358 162   CMP Latest Ref Rng & Units 09/01/2021 08/11/2021 08/01/2021  BUN 4 - 21 6 5 6   Creatinine 0.5 - 1.1 0.6 0.5 0.6  Sodium 137 - 147 138 135(A) 131(A)  Potassium 3.4 - 5.3 3.2(A) 3.6 3.2(A)  Chloride 99 - 108 106 103 96(A)  CO2 13 - 22 28(A) 27(A) 29(A)  Calcium 8.7 - 10.7 9.4 8.5(A) 8.2(A)  Alkaline Phos 25 - 125 86 87 113  AST 13 - 35 28 31 28   ALT 7 - 35 26 23 23    ASSESSMENT & PLAN:  A 61 y.o. 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  She will proceed with her 3rd cycle of Taxotere/Cytoxan early next week.  She will continue to receive Neulasta to prevent severe neutropenia from delaying successive cycles of chemotherapy.  I will see her back in 3 weeks before she heads into her 4th  and final cycle of adjuvant Taxotere/Cytoxan.  The patient understands all the plans discussed today and is in agreement with them.     I, Rita Ohara, am acting as scribe for Marice Potter, MD    I have reviewed this report as typed by the medical scribe, and it is complete and accurate.  Dequincy Macarthur Critchley, MD

## 2021-09-01 ENCOUNTER — Other Ambulatory Visit: Payer: Self-pay

## 2021-09-01 ENCOUNTER — Inpatient Hospital Stay: Payer: Medicare Other

## 2021-09-01 ENCOUNTER — Inpatient Hospital Stay: Payer: Medicare Other | Attending: Oncology | Admitting: Oncology

## 2021-09-01 ENCOUNTER — Encounter: Payer: Self-pay | Admitting: Oncology

## 2021-09-01 VITALS — BP 164/91 | HR 114 | Temp 98.0°F | Resp 16 | Ht 62.0 in | Wt 192.0 lb

## 2021-09-01 DIAGNOSIS — Z5189 Encounter for other specified aftercare: Secondary | ICD-10-CM | POA: Insufficient documentation

## 2021-09-01 DIAGNOSIS — Z170 Estrogen receptor positive status [ER+]: Secondary | ICD-10-CM | POA: Diagnosis not present

## 2021-09-01 DIAGNOSIS — C50411 Malignant neoplasm of upper-outer quadrant of right female breast: Secondary | ICD-10-CM | POA: Insufficient documentation

## 2021-09-01 DIAGNOSIS — Z5111 Encounter for antineoplastic chemotherapy: Secondary | ICD-10-CM | POA: Insufficient documentation

## 2021-09-01 LAB — HEPATIC FUNCTION PANEL
ALT: 26 (ref 7–35)
AST: 28 (ref 13–35)
Alkaline Phosphatase: 86 (ref 25–125)
Bilirubin, Total: 0.5

## 2021-09-01 LAB — BASIC METABOLIC PANEL
BUN: 6 (ref 4–21)
CO2: 28 — AB (ref 13–22)
Chloride: 106 (ref 99–108)
Creatinine: 0.6 (ref 0.5–1.1)
Glucose: 138
Potassium: 3.2 — AB (ref 3.4–5.3)
Sodium: 138 (ref 137–147)

## 2021-09-01 LAB — CBC AND DIFFERENTIAL
HCT: 39 (ref 36–46)
Hemoglobin: 12.9 (ref 12.0–16.0)
Neutrophils Absolute: 4.16
Platelets: 182 (ref 150–399)
WBC: 6.6

## 2021-09-01 LAB — CBC: RBC: 4.04 (ref 3.87–5.11)

## 2021-09-01 LAB — COMPREHENSIVE METABOLIC PANEL
Albumin: 3.3 — AB (ref 3.5–5.0)
Calcium: 9.4 (ref 8.7–10.7)

## 2021-09-01 MED FILL — Docetaxel Soln for IV Infusion 160 MG/16ML: INTRAVENOUS | Qty: 15 | Status: AC

## 2021-09-01 MED FILL — Cyclophosphamide For Inj 1 GM: INTRAMUSCULAR | Qty: 59 | Status: AC

## 2021-09-01 MED FILL — Dexamethasone Sodium Phosphate Inj 100 MG/10ML: INTRAMUSCULAR | Qty: 1 | Status: AC

## 2021-09-01 NOTE — Progress Notes (Signed)
Face to face visit with pt in [place]. Pt is here for Med Onc follow up and labs. Pt reports that she is doing okay with treatments. She explains that she becomes very tired after but is feeling well now. We discussed that chemo can have a cumulative affect and down the road she may seem to have a harder  time than before and that is because the chemo is accumulating and having a more powerful affect. Encouraged pt to call with questions and concerns. ?

## 2021-09-02 ENCOUNTER — Encounter: Payer: Self-pay | Admitting: Oncology

## 2021-09-02 NOTE — Progress Notes (Signed)
?Kathleen Sherman  ?[address] ?[place],  [place]  [address] ?[phone] ? ?Clinic Day:  09/02/2021 ? ?Referring physician: Laverle Hobby, NP ? ? ?HISTORY OF PRESENT ILLNESS:  ?The patient is a 61 y.o. 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  She comes in today to be evaluated before heading into her 3rd cycle of adjuvant Taxotere/Cytoxan.  Since her last visit, the patient has been doing well.  She tolerated her 2nd cycle of treatment much better.  She denies having any new findings which concern her for  early disease recurrence. ? ?PHYSICAL EXAM:  ?Blood pressure (!) 164/91, pulse (!) 114, temperature 98 ?F (36.7 ?C), resp. rate 16, height '5\' 2"'$  (1.575 m), weight 192 lb (87.1 kg), SpO2 94 %. ?Wt Readings from Last 3 Encounters:  ?09/01/21 192 lb (87.1 kg)  ?08/16/21 191 lb 12 oz (87 kg)  ?08/14/21 193 lb 12 oz (87.9 kg)  ? ?Body mass index is 35.12 kg/m?Marland Kitchen ?Performance status (ECOG): 0 - Asymptomatic ?Physical Exam ?Constitutional:   ?   Appearance: Normal appearance.  ?HENT:  ?   Mouth/Throat:  ?   Pharynx: Oropharynx is clear. No oropharyngeal exudate.  ?Cardiovascular:  ?   Rate and Rhythm: Normal rate and regular rhythm.  ?   Heart sounds: No murmur heard. ?  No friction rub. No gallop.  ?Pulmonary:  ?   Breath sounds: Normal breath sounds.  ?Chest:  ?Breasts: ?   Right: Absent. No swelling, bleeding, inverted nipple, mass, nipple discharge or skin change.  ?   Left: No swelling, bleeding, inverted nipple, mass, nipple discharge or skin change.  ?Abdominal:  ?   General: Bowel sounds are normal. There is no distension.  ?   Palpations: Abdomen is soft. There is no mass.  ?   Tenderness: There is no abdominal tenderness.  ?Musculoskeletal:     ?   General: No tenderness.  ?   Cervical back: Normal range of motion and neck supple.  ?   Right lower leg: No edema.  ?   Left lower leg: No edema.   ?Lymphadenopathy:  ?   Cervical: No cervical adenopathy.  ?   Right cervical: No superficial, deep or posterior cervical adenopathy. ?   Left cervical: No superficial, deep or posterior cervical adenopathy.  ?   Upper Body:  ?   Right upper body: No supraclavicular or axillary adenopathy.  ?   Left upper body: No supraclavicular or axillary adenopathy.  ?   Lower Body: No right inguinal adenopathy. No left inguinal adenopathy.  ?Skin: ?   Coloration: Skin is not jaundiced.  ?   Findings: No lesion or rash.  ?Neurological:  ?   General: No focal deficit present.  ?   Mental Status: She is alert and oriented to person, place, and time. Mental status is at baseline.  ?Psychiatric:     ?   Mood and Affect: Mood normal.     ?   Behavior: Behavior normal.     ?   Thought Content: Thought content normal.     ?   Judgment: Judgment normal.  ? ?LABS:  ? ?CBC Latest Ref Rng & Units 09/01/2021 08/11/2021 08/01/2021  ?WBC - 6.6 5.9 15.9  ?Hemoglobin 12.0 - 16.0 12.9 12.3 14.2  ?Hematocrit 36 - 46 39 36 42  ?Platelets 150 - 399 182 358 162  ? ?CMP Latest Ref Rng &  Units 09/01/2021 08/11/2021 08/01/2021  ?BUN 4 - '21 6 5 6  '$ ?Creatinine 0.5 - 1.1 0.6 0.5 0.6  ?Sodium 137 - 147 138 135(A) 131(A)  ?Potassium 3.4 - 5.3 3.2(A) 3.6 3.2(A)  ?Chloride 99 - 108 106 103 96(A)  ?CO2 13 - 22 28(A) 27(A) 29(A)  ?Calcium 8.7 - 10.7 9.4 8.5(A) 8.2(A)  ?Alkaline Phos 25 - 125 86 87 113  ?AST 13 - 35 '28 31 28  '$ ?ALT 7 - 35 '26 23 23  '$ ? ?ASSESSMENT & PLAN:  ?A 61 y.o. 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  She will proceed with her 3rd cycle of Taxotere/Cytoxan early next week.  She will continue to receive Neulasta to prevent severe neutropenia from delaying successive cycles of chemotherapy.  Overall, she appears to be doing much better.  I will see her back in 3 weeks before she heads into her 4th and final cycle of adjuvant Taxotere/Cytoxan.  The patient understands all the  plans discussed today and is in agreement with them.   ? ? ? ?Orvis Stann Macarthur Critchley, MD   ? ? ?  ?

## 2021-09-04 ENCOUNTER — Other Ambulatory Visit: Payer: Self-pay

## 2021-09-04 ENCOUNTER — Inpatient Hospital Stay: Payer: Medicare Other

## 2021-09-04 VITALS — BP 123/85 | HR 107 | Temp 98.0°F | Resp 18 | Ht 62.4 in | Wt 192.5 lb

## 2021-09-04 DIAGNOSIS — Z5111 Encounter for antineoplastic chemotherapy: Secondary | ICD-10-CM | POA: Diagnosis present

## 2021-09-04 DIAGNOSIS — Z5189 Encounter for other specified aftercare: Secondary | ICD-10-CM | POA: Diagnosis not present

## 2021-09-04 DIAGNOSIS — Z170 Estrogen receptor positive status [ER+]: Secondary | ICD-10-CM | POA: Diagnosis not present

## 2021-09-04 DIAGNOSIS — C50411 Malignant neoplasm of upper-outer quadrant of right female breast: Secondary | ICD-10-CM | POA: Diagnosis not present

## 2021-09-04 MED ORDER — SODIUM CHLORIDE 0.9% FLUSH
10.0000 mL | INTRAVENOUS | Status: DC | PRN
  Administered 2021-09-04: 10 mL

## 2021-09-04 MED ORDER — PALONOSETRON HCL INJECTION 0.25 MG/5ML
0.2500 mg | Freq: Once | INTRAVENOUS | Status: AC
  Administered 2021-09-04: 0.25 mg via INTRAVENOUS
  Filled 2021-09-04: qty 5

## 2021-09-04 MED ORDER — SODIUM CHLORIDE 0.9 % IV SOLN
75.0000 mg/m2 | Freq: Once | INTRAVENOUS | Status: AC
  Administered 2021-09-04: 150 mg via INTRAVENOUS
  Filled 2021-09-04: qty 15

## 2021-09-04 MED ORDER — HEPARIN SOD (PORK) LOCK FLUSH 100 UNIT/ML IV SOLN
500.0000 [IU] | Freq: Once | INTRAVENOUS | Status: AC | PRN
  Administered 2021-09-04: 500 [IU]

## 2021-09-04 MED ORDER — SODIUM CHLORIDE 0.9 % IV SOLN: Freq: Once | INTRAVENOUS | Status: AC

## 2021-09-04 MED ORDER — SODIUM CHLORIDE 0.9 % IV SOLN
10.0000 mg | Freq: Once | INTRAVENOUS | Status: AC
  Administered 2021-09-04: 10 mg via INTRAVENOUS
  Filled 2021-09-04: qty 10

## 2021-09-04 MED ORDER — SODIUM CHLORIDE 0.9 % IV SOLN
600.0000 mg/m2 | Freq: Once | INTRAVENOUS | Status: AC
  Administered 2021-09-04: 1180 mg via INTRAVENOUS
  Filled 2021-09-04: qty 59

## 2021-09-04 NOTE — Patient Instructions (Signed)
[facility]  Discharge Instructions: ?Thank you for choosing [facility] to provide your oncology and hematology care.  ?If you have a lab appointment with the [facility], please go directly to the [facility] and check in at the registration area. ?  ?Wear comfortable clothing and clothing appropriate for easy access to any Portacath or PICC line.  ? ?We strive to give you quality time with your provider. You may need to reschedule your appointment if you arrive late (15 or more minutes).  Arriving late affects you and other patients whose appointments are after yours.  Also, if you miss three or more appointments without notifying the office, you may be dismissed from the clinic at the provider?s discretion.    ?  ?For prescription refill requests, have your pharmacy contact our office and allow 72 hours for refills to be completed.   ? ?Today you received the following chemotherapy and/or immunotherapy agents docetaxel cyclophosphamide    ?  ?To help prevent nausea and vomiting after your treatment, we encourage you to take your nausea medication as directed. ? ?BELOW ARE SYMPTOMS THAT SHOULD BE REPORTED IMMEDIATELY: ?*FEVER GREATER THAN 100.4 F (38 ?C) OR HIGHER ?*CHILLS OR SWEATING ?*NAUSEA AND VOMITING THAT IS NOT CONTROLLED WITH YOUR NAUSEA MEDICATION ?*UNUSUAL SHORTNESS OF BREATH ?*UNUSUAL BRUISING OR BLEEDING ?*URINARY PROBLEMS (pain or burning when urinating, or frequent urination) ?*BOWEL PROBLEMS (unusual diarrhea, constipation, pain near the anus) ?TENDERNESS IN MOUTH AND THROAT WITH OR WITHOUT PRESENCE OF ULCERS (sore throat, sores in mouth, or a toothache) ?UNUSUAL RASH, SWELLING OR PAIN  ?UNUSUAL VAGINAL DISCHARGE OR ITCHING  ? ?Items with * indicate a potential emergency and should be followed up as soon as possible or go to the Emergency Department if any problems should occur. ? ?Please show the CHEMOTHERAPY ALERT CARD or IMMUNOTHERAPY ALERT CARD at  check-in to the Emergency Department and triage nurse. ? ?Should you have questions after your visit or need to cancel or reschedule your appointment, please contact [facility]  Dept: [phone]  and follow the prompts.  Office hours are 8:00 a.m. to 4:30 p.m. Monday - Friday. Please note that voicemails left after 4:00 p.m. may not be returned until the following business day.  We are closed weekends and major holidays. You have access to a nurse at all times for urgent questions. Please call the main number to the clinic Dept: [phone] and follow the prompts. ? ?For any non-urgent questions, you may also contact your provider using MyChart. We now offer e-Visits for anyone 60 and older to request care online for non-urgent symptoms. For details visit [email or web address]. ?  ?Also download the MyChart app! Go to the app store, search "MyChart", open the app, select [facility], and log in with your MyChart username and password. ? ?Due to Covid, a mask is required upon entering the hospital/clinic. If you do not have a mask, one will be given to you upon arrival. For doctor visits, patients may have 1 support person aged 59 or older with them. For treatment visits, patients cannot have anyone with them due to current Covid guidelines and our immunocompromised population.  ? ? ?

## 2021-09-06 ENCOUNTER — Other Ambulatory Visit: Payer: Self-pay

## 2021-09-06 ENCOUNTER — Inpatient Hospital Stay: Payer: Medicare Other

## 2021-09-06 ENCOUNTER — Encounter: Payer: Self-pay | Admitting: Oncology

## 2021-09-06 VITALS — BP 130/77 | HR 86 | Temp 98.0°F | Resp 18 | Wt 193.2 lb

## 2021-09-06 DIAGNOSIS — Z170 Estrogen receptor positive status [ER+]: Secondary | ICD-10-CM

## 2021-09-06 DIAGNOSIS — Z5111 Encounter for antineoplastic chemotherapy: Secondary | ICD-10-CM | POA: Diagnosis not present

## 2021-09-06 DIAGNOSIS — C50411 Malignant neoplasm of upper-outer quadrant of right female breast: Secondary | ICD-10-CM

## 2021-09-06 MED ORDER — PEGFILGRASTIM-BMEZ 6 MG/0.6ML ~~LOC~~ SOSY
6.0000 mg | PREFILLED_SYRINGE | Freq: Once | SUBCUTANEOUS | Status: AC
  Administered 2021-09-06: 6 mg via SUBCUTANEOUS
  Filled 2021-09-06: qty 0.6

## 2021-09-06 NOTE — Patient Instructions (Signed)

## 2021-09-06 NOTE — Progress Notes (Signed)
1340:PT STABLE AT TIME OF DISCHARGE ?

## 2021-09-21 NOTE — Progress Notes (Signed)
?Kathleen Sherman  ?[address] ?[place],  [place]  [address] ?[phone] ? ?Clinic Day:  09/22/2021 ? ?Referring physician: Laverle Hobby, NP ? ? ?HISTORY OF PRESENT ILLNESS:  ?The patient is a 61 y.o. 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  She comes in today to be evaluated before heading into her 4th and final cycle of adjuvant Taxotere/Cytoxan.  Since her last visit, the patient has been doing okay.  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  She also claims her feet were intermittently swollen from her last cycle of treatment.  Both problems appear to be due to the Taxotere component of her adjuvant chemotherapy.  Otherwise, she denies having any new findings which concern her for  early disease recurrence. ? ?PHYSICAL EXAM:  ?Blood pressure 127/88, pulse (!) 123, temperature 98 ?F (36.7 ?C), resp. rate 16, height '5\' 2"'$  (1.575 m), weight 184 lb 6.4 oz (83.6 kg), SpO2 97 %. ?Wt Readings from Last 3 Encounters:  ?09/22/21 184 lb 6.4 oz (83.6 kg)  ?09/06/21 193 lb 4 oz (87.7 kg)  ?09/04/21 192 lb 8 oz (87.3 kg)  ? ?Body mass index is 33.73 kg/m?Marland Kitchen ?Performance status (ECOG): 0 - Asymptomatic ?Physical Exam ?Constitutional:   ?   Appearance: Normal appearance.  ?HENT:  ?   Mouth/Throat:  ?   Pharynx: Oropharynx is clear. No oropharyngeal exudate.  ?Cardiovascular:  ?   Rate and Rhythm: Normal rate and regular rhythm.  ?   Heart sounds: No murmur heard. ?  No friction rub. No gallop.  ?Pulmonary:  ?   Breath sounds: Normal breath sounds.  ?Chest:  ?Breasts: ?   Right: Absent. No swelling, bleeding, inverted nipple, mass, nipple discharge or skin change.  ?   Left: No swelling, bleeding, inverted nipple, mass, nipple discharge or skin change.  ?Abdominal:  ?   General: Bowel sounds are normal. There is no distension.  ?    Palpations: Abdomen is soft. There is no mass.  ?   Tenderness: There is no abdominal tenderness.  ?Musculoskeletal:     ?   General: No tenderness.  ?   Cervical back: Normal range of motion and neck supple.  ?   Right lower leg: No edema.  ?   Left lower leg: No edema.  ?Lymphadenopathy:  ?   Cervical: No cervical adenopathy.  ?   Right cervical: No superficial, deep or posterior cervical adenopathy. ?   Left cervical: No superficial, deep or posterior cervical adenopathy.  ?   Upper Body:  ?   Right upper body: No supraclavicular or axillary adenopathy.  ?   Left upper body: No supraclavicular or axillary adenopathy.  ?   Lower Body: No right inguinal adenopathy. No left inguinal adenopathy.  ?Skin: ?   Coloration: Skin is not jaundiced.  ?   Findings: No lesion or rash.  ?Neurological:  ?   General: No focal deficit present.  ?   Mental Status: She is alert and oriented to person, place, and time. Mental status is at baseline.  ?Psychiatric:     ?   Mood and Affect: Mood normal.     ?   Behavior: Behavior normal.     ?   Thought Content: Thought content normal.     ?   Judgment: Judgment normal.  ? ?LABS:  ? ? ?  Latest Ref Rng & Units 09/22/2021  ? 12:00 AM 09/01/2021  ? 12:00 AM 08/11/2021  ? 12:00 AM  ?CBC  ?WBC  7.1      6.6   5.9       ?Hemoglobin 12.0 - 16.0 11.6      12.9   12.3       ?Hematocrit 36 - 46 36      39   36       ?Platelets 150 - 400 K/uL 388      182   358       ?  ? This result is from an external source.  ? ? ?  Latest Ref Rng & Units 09/22/2021  ? 12:00 AM 09/01/2021  ? 12:00 AM 08/11/2021  ? 12:00 AM  ?CMP  ?BUN 4 - '21 5      6   5       '$ ?Creatinine 0.5 - 1.1 0.5      0.6   0.5       ?Sodium 137 - 147 135      138   135       ?Potassium 3.5 - 5.1 mEq/L 3.3      3.2   3.6       ?Chloride 99 - 108 102      106   103       ?CO2 13 - '22 27      28   27       '$ ?Calcium 8.7 - 10.7 8.6      9.4   8.5       ?Alkaline Phos 25 - 125 76      86   87       ?AST 13 - 35 '22      28   31       '$ ?ALT 7 - 35 U/L  '17      26   23       '$ ?  ? This result is from an external source.  ? ?ASSESSMENT & PLAN:  ?A 61 y.o. 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  She will proceed with her 4th and final cycle of Taxotere/Cytoxan early next week.  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  She will continue to receive Neulasta to prevent severe neutropenia from developing.  I will also prescribe her Lasix prn extremity swelling.  Overall, I think she has done well with her adjuvant chemotherapy.   I will see her back in 4 weeks.  At that time, she will commence with endocrine therapy.  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  The patient understands all the plans discussed today and is in agreement with them.   ? ? ?Taviana Westergren Macarthur Critchley, MD   ? ? ?  ?

## 2021-09-22 ENCOUNTER — Encounter: Payer: Self-pay | Admitting: Oncology

## 2021-09-22 ENCOUNTER — Inpatient Hospital Stay: Payer: Medicare Other

## 2021-09-22 ENCOUNTER — Inpatient Hospital Stay: Payer: Medicare Other | Admitting: Oncology

## 2021-09-22 ENCOUNTER — Other Ambulatory Visit: Payer: Self-pay

## 2021-09-22 VITALS — BP 127/88 | HR 123 | Temp 98.0°F | Resp 16 | Ht 62.0 in | Wt 184.4 lb

## 2021-09-22 DIAGNOSIS — C50411 Malignant neoplasm of upper-outer quadrant of right female breast: Secondary | ICD-10-CM

## 2021-09-22 DIAGNOSIS — Z170 Estrogen receptor positive status [ER+]: Secondary | ICD-10-CM

## 2021-09-22 LAB — COMPREHENSIVE METABOLIC PANEL
Albumin: 3 — AB (ref 3.5–5.0)
Calcium: 8.6 — AB (ref 8.7–10.7)

## 2021-09-22 LAB — CBC AND DIFFERENTIAL
HCT: 36 (ref 36–46)
Hemoglobin: 11.6 — AB (ref 12.0–16.0)
Neutrophils Absolute: 5.18
Platelets: 388 10*3/uL (ref 150–400)
WBC: 7.1

## 2021-09-22 LAB — BASIC METABOLIC PANEL
BUN: 5 (ref 4–21)
CO2: 27 — AB (ref 13–22)
Chloride: 102 (ref 99–108)
Creatinine: 0.5 (ref 0.5–1.1)
Glucose: 123
Potassium: 3.3 mEq/L — AB (ref 3.5–5.1)
Sodium: 135 — AB (ref 137–147)

## 2021-09-22 LAB — CBC: RBC: 3.84 — AB (ref 3.87–5.11)

## 2021-09-22 LAB — HEPATIC FUNCTION PANEL
ALT: 17 U/L (ref 7–35)
AST: 22 (ref 13–35)
Alkaline Phosphatase: 76 (ref 25–125)
Bilirubin, Total: 0.6

## 2021-09-22 MED FILL — Docetaxel Soln for IV Infusion 160 MG/16ML: INTRAVENOUS | Qty: 15 | Status: AC

## 2021-09-25 ENCOUNTER — Other Ambulatory Visit: Payer: Self-pay

## 2021-09-25 ENCOUNTER — Inpatient Hospital Stay: Payer: Medicare Other

## 2021-09-25 VITALS — BP 130/84 | HR 113 | Temp 98.6°F | Resp 18 | Ht 62.6 in | Wt 187.2 lb

## 2021-09-25 DIAGNOSIS — Z5111 Encounter for antineoplastic chemotherapy: Secondary | ICD-10-CM | POA: Diagnosis not present

## 2021-09-25 DIAGNOSIS — Z170 Estrogen receptor positive status [ER+]: Secondary | ICD-10-CM

## 2021-09-25 MED ORDER — SODIUM CHLORIDE 0.9% FLUSH
10.0000 mL | INTRAVENOUS | Status: DC | PRN
  Administered 2021-09-25: 10 mL

## 2021-09-25 MED ORDER — SODIUM CHLORIDE 0.9 % IV SOLN: Freq: Once | INTRAVENOUS | Status: AC

## 2021-09-25 MED ORDER — SODIUM CHLORIDE 0.9 % IV SOLN
600.0000 mg/m2 | Freq: Once | INTRAVENOUS | Status: AC
  Administered 2021-09-25: 1180 mg via INTRAVENOUS
  Filled 2021-09-25: qty 50

## 2021-09-25 MED ORDER — SODIUM CHLORIDE 0.9 % IV SOLN
10.0000 mg | Freq: Once | INTRAVENOUS | Status: AC
  Administered 2021-09-25: 10 mg via INTRAVENOUS
  Filled 2021-09-25: qty 10

## 2021-09-25 MED ORDER — HEPARIN SOD (PORK) LOCK FLUSH 100 UNIT/ML IV SOLN
500.0000 [IU] | Freq: Once | INTRAVENOUS | Status: AC | PRN
  Administered 2021-09-25: 500 [IU]

## 2021-09-25 MED ORDER — SODIUM CHLORIDE 0.9 % IV SOLN
56.2500 mg/m2 | Freq: Once | INTRAVENOUS | Status: AC
  Administered 2021-09-25: 110 mg via INTRAVENOUS
  Filled 2021-09-25: qty 11

## 2021-09-25 MED ORDER — PALONOSETRON HCL INJECTION 0.25 MG/5ML
0.2500 mg | Freq: Once | INTRAVENOUS | Status: AC
  Administered 2021-09-25: 0.25 mg via INTRAVENOUS
  Filled 2021-09-25: qty 5

## 2021-09-25 NOTE — Patient Instructions (Addendum)
[facility]  Discharge Instructions: ?Thank you for choosing [facility] to provide your oncology and hematology care.  ?If you have a lab appointment with the [facility], please go directly to the [facility] and check in at the registration area. ?  ?Wear comfortable clothing and clothing appropriate for easy access to any Portacath or PICC line.  ? ?We strive to give you quality time with your provider. You may need to reschedule your appointment if you arrive late (15 or more minutes).  Arriving late affects you and other patients whose appointments are after yours.  Also, if you miss three or more appointments without notifying the office, you may be dismissed from the clinic at the provider?s discretion.    ?  ?For prescription refill requests, have your pharmacy contact our office and allow 72 hours for refills to be completed.   ? ?Today you received the following chemotherapy and/or immunotherapy agents Taxotere, Cytoxan    ?  ?To help prevent nausea and vomiting after your treatment, we encourage you to take your nausea medication as directed. ? ?BELOW ARE SYMPTOMS THAT SHOULD BE REPORTED IMMEDIATELY: ?*FEVER GREATER THAN 100.4 F (38 ?C) OR HIGHER ?*CHILLS OR SWEATING ?*NAUSEA AND VOMITING THAT IS NOT CONTROLLED WITH YOUR NAUSEA MEDICATION ?*UNUSUAL SHORTNESS OF BREATH ?*UNUSUAL BRUISING OR BLEEDING ?*URINARY PROBLEMS (pain or burning when urinating, or frequent urination) ?*BOWEL PROBLEMS (unusual diarrhea, constipation, pain near the anus) ?TENDERNESS IN MOUTH AND THROAT WITH OR WITHOUT PRESENCE OF ULCERS (sore throat, sores in mouth, or a toothache) ?UNUSUAL RASH, SWELLING OR PAIN  ?UNUSUAL VAGINAL DISCHARGE OR ITCHING  ? ?Items with * indicate a potential emergency and should be followed up as soon as possible or go to the Emergency Department if any problems should occur. ? ?Please show the CHEMOTHERAPY ALERT CARD or IMMUNOTHERAPY ALERT CARD at check-in to  the Emergency Department and triage nurse. ? ?Should you have questions after your visit or need to cancel or reschedule your appointment, please contact [facility]  Dept: [phone]  and follow the prompts.  Office hours are 8:00 a.m. to 4:30 p.m. Monday - Friday. Please note that voicemails left after 4:00 p.m. may not be returned until the following business day.  We are closed weekends and major holidays. You have access to a nurse at all times for urgent questions. Please call the main number to the clinic Dept: [phone] and follow the prompts. ? ?For any non-urgent questions, you may also contact your provider using MyChart. We now offer e-Visits for anyone 15 and older to request care online for non-urgent symptoms. For details visit [email or web address]. ?  ?Also download the MyChart app! Go to the app store, search "MyChart", open the app, select [facility], and log in with your MyChart username and password. ? ?Due to Covid, a mask is required upon entering the hospital/clinic. If you do not have a mask, one will be given to you upon arrival. For doctor visits, patients may have 1 support person aged 81 or older with them. For treatment visits, patients cannot have anyone with them due to current Covid guidelines and our immunocompromised population.  ? ?Antibiotic Medicine, Adult ?Antibiotic medicines treat infections caused by a type of germ called bacteria. These medicines work by killing the bacteria that make you sick. You should take antibiotic medicines safely and only when needed. ?When do I need to take antibiotics? ?You may need antibiotics for: ?A urinary  tract infection (UTI). ?Strep throat. ?A sinus infection caused by bacteria. ?Meningitis. This affects the spinal cord and brain. ?A bad lung infection. ?You may start your medicines while your doctor waits for your results on some tests. When your results come back, your doctor may change or stop your  medicine based on your test results. ?When are antibiotics not needed? ?You do not need these medicines for most common illnesses, such as: ?A cold. ?The flu. ?A sore throat. ?Mucus being an odd color. ?Bronchitis. ?Sometimes, antibiotics are not needed for an infection caused by bacteria. Do not ask for these medicines, or take them, when they are not needed. ?How long should I take my antibiotic? ?You need to take all your medicine. Take your antibiotic medicine as told by your doctor. Do not stop taking the antibiotic even if you start to feel better. If you stop taking it too soon: ?You may feel sick again. ?Your infection may get harder to treat. ?Antibiotics need different amounts of time to work. Some treatments last just a few days. Some last about a week to 10 days. Sometimes, you may need to take antibiotics for a few weeks to fully treat your infection. ?What if I miss a dose? ?Try not to miss a dose. If you miss a dose, call your doctor or pharmacist. Sometimes, it is okay to take the missed dose as soon as you can. Do not take an extra dose. ?What are the risks of taking antibiotics? ?Antibiotics can cause: ?Allergic attacks. ?A feeling like you may vomit (nausea). ?Yeast infections. ?Liver problems. ?These medicines can cause an infection called C. diff. This causes watery poop (diarrhea). This happens when antibiotics kill good germs in your gut. This lets C. diff grow. Tell your doctor right away if: ?You get watery poop while taking your antibiotic. ?You get watery poop after you stop your antibiotic. C. diff can happen weeks after you stop your medicine. ?You also have a risk of getting an infection in the future that antibiotics cannot treat (antibiotic-resistant infection). These infections can get very bad. Sometimes, they can be life-threatening. ?Do antibiotics affect birth control? ?Birth control pills may not work. If you take birth control pills: ?Keep taking them as normal. ?Use a second  form of birth control, such as a condom. Do this for as long as told by your doctor. ?What else should I know about taking antibiotics? ?You need to take these medicines exactly as told. Make sure to do these things: ?Take the right amount of medicine at the same time each day. ?Ask your doctor: ?How long to wait between doses. ?If you should take your medicine with food. ?If you should stay away from some foods, drinks, or medicines. ?What side effects you should watch for. ?Use only the medicines that your doctor said to use. Do not use medicines that were given to someone else. ?Drink a large glass of water when you take your medicine. Drink enough fluid to keep your pee (urine) pale yellow. ?Ask your pharmacist for a tool to measure your medicine. This may be a syringe, cup, or spoon. ?Throw out any extra medicine. ?Follow these instructions at home: ?Take over-the-counter and prescription medicines as told by your doctor. ?Return to your normal activities as told by your doctor. Ask your doctor what activities are safe for you. ?Keep all follow-up visits as told by your doctor. This is important. ?Contact a doctor if: ?You feel worse. ?You have one of these after you  start your medicine: ?New joint pain. ?New muscle aches. ?You have side effects from your medicine, such as: ?Stomach pain. ?Watery poop. ?Feeling like you may vomit. ?White patches in your mouth or throat. ?Get help right away if: ?You have a very bad allergic attack. If this happens, stop taking your medicine right away. You may get: ?Hives. These are raised, itchy, red bumps on your skin. ?Skin rash. ?Trouble breathing. ?Breathing that has whistling sounds. ?Swelling on your body. ?A dizzy feeling. ?Vomiting. ?You have symptoms of liver problems. You may have: ?Dark pee, or pee that is the color of blood. ?Yellow skin. ?Easy bruising. ?Easy bleeding. ?You have very bad watery poop. ?You have cramps in your belly. ?You have a very bad  headache. ?These symptoms may be an emergency. Do not wait to see if the symptoms will go away. Get medical help right away. Call your local emergency services (911 in the [place] Do not drive yourself to the hospital.

## 2021-09-27 ENCOUNTER — Inpatient Hospital Stay: Payer: Medicare Other

## 2021-09-27 VITALS — BP 141/81 | HR 115 | Temp 98.7°F | Resp 18 | Ht 62.6 in | Wt 187.0 lb

## 2021-09-27 DIAGNOSIS — Z5111 Encounter for antineoplastic chemotherapy: Secondary | ICD-10-CM | POA: Diagnosis not present

## 2021-09-27 DIAGNOSIS — Z170 Estrogen receptor positive status [ER+]: Secondary | ICD-10-CM

## 2021-09-27 MED ORDER — PEGFILGRASTIM-BMEZ 6 MG/0.6ML ~~LOC~~ SOSY
6.0000 mg | PREFILLED_SYRINGE | Freq: Once | SUBCUTANEOUS | Status: AC
  Administered 2021-09-27: 6 mg via SUBCUTANEOUS
  Filled 2021-09-27: qty 0.6

## 2021-09-27 NOTE — Patient Instructions (Signed)

## 2021-10-03 ENCOUNTER — Encounter: Payer: Self-pay | Admitting: Oncology

## 2021-10-19 NOTE — Progress Notes (Signed)
?[facility]  ?[address] ?[place],  [place]  [address] ?[phone] ? ?Clinic Day:  10/20/2021 ? ?Referring physician: Laverle Hobby, NP ? ? ?HISTORY OF PRESENT ILLNESS:  ?The patient is a 61 y.o. 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  She comes in today to be reassessed after finishing her 4 cycles of adjuvant Taxotere/Cytoxan.  Since her last visit, the patient has been doing okay.  She still feels weak from all of her adjuvant chemotherapy, but claims to be getting better.  She is scheduled to see radiation oncology next week to discuss adjuvant breast radiation.  As it pertains to her breast cancer, she denies having any new findings which concern her for  early disease recurrence. ? ?PHYSICAL EXAM:  ?Blood pressure 129/76, pulse (!) 114, temperature 98 ?F (36.7 ?C), resp. rate 16, height '5\' 2"'$  (1.575 m), weight 185 lb 3.2 oz (84 kg), SpO2 97 %. ?Wt Readings from Last 3 Encounters:  ?10/20/21 185 lb 3.2 oz (84 kg)  ?09/27/21 187 lb (84.8 kg)  ?09/25/21 187 lb 4 oz (84.9 kg)  ? ?Body mass index is 33.87 kg/m?Marland Kitchen ?Performance status (ECOG): 0 - Asymptomatic ?Physical Exam ?Constitutional:   ?   Appearance: Normal appearance.  ?HENT:  ?   Mouth/Throat:  ?   Pharynx: Oropharynx is clear. No oropharyngeal exudate.  ?Cardiovascular:  ?   Rate and Rhythm: Normal rate and regular rhythm.  ?   Heart sounds: No murmur heard. ?  No friction rub. No gallop.  ?Pulmonary:  ?   Breath sounds: Normal breath sounds.  ?Chest:  ?Breasts: ?   Right: Absent. No swelling, bleeding, inverted nipple, mass, nipple discharge or skin change.  ?   Left: No swelling, bleeding, inverted nipple, mass, nipple discharge or skin change.  ?Abdominal:  ?   General: Bowel sounds are normal. There is no distension.  ?   Palpations: Abdomen is soft. There is no mass.  ?   Tenderness: There is no abdominal tenderness.  ?Musculoskeletal:      ?   General: No tenderness.  ?   Cervical back: Normal range of motion and neck supple.  ?   Right lower leg: No edema.  ?   Left lower leg: No edema.  ?Lymphadenopathy:  ?   Cervical: No cervical adenopathy.  ?   Right cervical: No superficial, deep or posterior cervical adenopathy. ?   Left cervical: No superficial, deep or posterior cervical adenopathy.  ?   Upper Body:  ?   Right upper body: No supraclavicular or axillary adenopathy.  ?   Left upper body: No supraclavicular or axillary adenopathy.  ?   Lower Body: No right inguinal adenopathy. No left inguinal adenopathy.  ?Skin: ?   Coloration: Skin is not jaundiced.  ?   Findings: No lesion or rash.  ?Neurological:  ?   General: No focal deficit present.  ?   Mental Status: She is alert and oriented to person, place, and time. Mental status is at baseline.  ?Psychiatric:     ?   Mood and Affect: Mood normal.     ?   Behavior: Behavior normal.     ?   Thought Content: Thought content normal.     ?   Judgment: Judgment normal.  ? ?LABS:  ? ? ?  Latest Ref Rng & Units 10/20/2021  ? 12:00 AM 09/22/2021  ?  12:00 AM 09/01/2021  ? 12:00 AM  ?CBC  ?WBC  5.7      7.1      6.6    ?Hemoglobin 12.0 - 16.0 11.4      11.6      12.9    ?Hematocrit 36 - 46 36      36      39    ?Platelets 150 - 400 K/uL 392      388      182    ?  ? This result is from an external source.  ? ? ?  Latest Ref Rng & Units 10/20/2021  ? 12:00 AM 09/22/2021  ? 12:00 AM 09/01/2021  ? 12:00 AM  ?CMP  ?BUN 4 - '21 5      5      6    '$ ?Creatinine 0.5 - 1.1 0.5      0.5      0.6    ?Sodium 137 - 147 137      135      138    ?Potassium 3.5 - 5.1 mEq/L 3.8      3.3      3.2    ?Chloride 99 - 108 102      102      106    ?CO2 13 - '22 30      27      28    '$ ?Calcium 8.7 - 10.7 9.1      8.6      9.4    ?Total Protein 6.3 - 8.2 g/dL 6.2         ?Alkaline Phos 25 - 125 74      76      86    ?AST 13 - 35 36      22      28    ?ALT 7 - 35 U/L '16      17      26    '$ ?  ? This result is from an external source.   ? ?ASSESSMENT & PLAN:  ?A 61 y.o. 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  She completed her 4 cycles of adjuvant Taxotere/Cytoxan in March 2023.  Based upon her clinical breast exam today, she remains disease-free.  I will place her on letrozole 2.5 mg daily, which she will take for a total of 5 years for her adjuvant endocrine therapy.  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  Moving forward, I will follow this patient every 4 months with clinical breast exams, with her next one to be scheduled in August 2023.  The patient understands all the plans discussed today and is in agreement with them.   ? ? ?Aiman Sonn Macarthur Critchley, MD   ? ? ?  ?

## 2021-10-20 ENCOUNTER — Inpatient Hospital Stay: Payer: Medicare Other

## 2021-10-20 ENCOUNTER — Other Ambulatory Visit: Payer: Self-pay

## 2021-10-20 ENCOUNTER — Inpatient Hospital Stay: Payer: Medicare Other | Attending: Oncology | Admitting: Oncology

## 2021-10-20 ENCOUNTER — Other Ambulatory Visit: Payer: Self-pay | Admitting: Hematology and Oncology

## 2021-10-20 VITALS — BP 129/76 | HR 114 | Temp 98.0°F | Resp 16 | Ht 62.0 in | Wt 185.2 lb

## 2021-10-20 DIAGNOSIS — C50411 Malignant neoplasm of upper-outer quadrant of right female breast: Secondary | ICD-10-CM

## 2021-10-20 DIAGNOSIS — Z170 Estrogen receptor positive status [ER+]: Secondary | ICD-10-CM | POA: Diagnosis not present

## 2021-10-20 LAB — CBC AND DIFFERENTIAL
HCT: 36 (ref 36–46)
Hemoglobin: 11.4 — AB (ref 12.0–16.0)
Neutrophils Absolute: 3.36
Platelets: 392 10*3/uL (ref 150–400)
WBC: 5.7

## 2021-10-20 LAB — BASIC METABOLIC PANEL
BUN: 5 (ref 4–21)
CO2: 30 — AB (ref 13–22)
Chloride: 102 (ref 99–108)
Creatinine: 0.5 (ref 0.5–1.1)
Glucose: 102
Potassium: 3.8 mEq/L (ref 3.5–5.1)
Sodium: 137 (ref 137–147)

## 2021-10-20 LAB — CORRECTED CALCIUM (CC13): Calcium, Corrected: 10

## 2021-10-20 LAB — COMPREHENSIVE METABOLIC PANEL
Albumin: 3.1 — AB (ref 3.5–5.0)
Calcium: 9.1 (ref 8.7–10.7)

## 2021-10-20 LAB — HEPATIC FUNCTION PANEL
ALT: 16 U/L (ref 7–35)
AST: 36 — AB (ref 13–35)
Alkaline Phosphatase: 74 (ref 25–125)
Bilirubin, Total: 0.4

## 2021-10-20 LAB — CBC
MCV: 96 (ref 81–99)
RBC: 3.78 — AB (ref 3.87–5.11)

## 2021-10-20 LAB — PROTEIN, TOTAL: Total Protein: 6.2 g/dL — AB (ref 6.3–8.2)

## 2021-10-20 MED ORDER — LETROZOLE 2.5 MG PO TABS: 2.5000 mg | ORAL_TABLET | Freq: Every day | ORAL | 3 refills | Status: DC

## 2021-10-20 NOTE — Progress Notes (Signed)
Face to face contact with pt in [place]. Pt has now completed her chemotherapy and is here for a follow up with Dr. Bobby Rumpf. Pt is still having some neuropathy but feels that she is gradually regaining her strength. Pt is waiting on insurance authorization to see Dr. Orlene Erm and begin her radiation.  ?

## 2021-10-21 ENCOUNTER — Encounter: Payer: Self-pay | Admitting: Oncology

## 2021-11-24 NOTE — Progress Notes (Signed)
Face to face contact with pt in [place]. Pt is doing well with her rad tx. She is having her 19th tx today. Pt reports that she is still having some neuropathy.  Other than than that, she has no complaints. Pt is excited to be nearing the end of her treatments. Encouraged pt to call with questions or concerns.

## 2021-12-14 ENCOUNTER — Other Ambulatory Visit: Payer: Self-pay | Admitting: Hematology and Oncology

## 2021-12-14 MED ORDER — GABAPENTIN 300 MG PO CAPS: 300.0000 mg | ORAL_CAPSULE | Freq: Three times a day (TID) | ORAL | 0 refills | Status: DC

## 2022-01-04 ENCOUNTER — Other Ambulatory Visit: Payer: Self-pay | Admitting: Hematology and Oncology

## 2022-01-04 DIAGNOSIS — G620 Drug-induced polyneuropathy: Secondary | ICD-10-CM

## 2022-01-04 DIAGNOSIS — C50411 Malignant neoplasm of upper-outer quadrant of right female breast: Secondary | ICD-10-CM

## 2022-01-22 ENCOUNTER — Other Ambulatory Visit: Payer: Self-pay

## 2022-02-19 NOTE — Progress Notes (Signed)
[facility]  [address] [place],  [place]  [address] [phone]  Clinic Day:  02/20/2022  Referring physician: Laverle Hobby, NP   HISTORY OF PRESENT ILLNESS:  The patient is a 61 y.o. 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  She completed her 4 cycles of adjuvant Taxotere/Cytoxan in March 2023.  She also completed adjuvant breast radiation.  The patient is currently taking letrozole on a daily basis for her adjuvant endocrine therapy.  She comes in today for routine follow-up.  Since her last visit, the patient has been doing well.  She denies having any changes with her breast/chest wall region which concern her for early disease recurrence.   PHYSICAL EXAM:  Blood pressure 131/85, pulse (!) 102, temperature 98.7 F (37.1 C), resp. rate 16, height '5\' 2"'$  (1.575 m), weight 178 lb 8 oz (81 kg), SpO2 96 %. Wt Readings from Last 3 Encounters:  02/20/22 178 lb 8 oz (81 kg)  10/20/21 185 lb 3.2 oz (84 kg)  09/27/21 187 lb (84.8 kg)   Body mass index is 32.65 kg/m. Performance status (ECOG): 0 - Asymptomatic Physical Exam Constitutional:      Appearance: Normal appearance.  HENT:     Mouth/Throat:     Pharynx: Oropharynx is clear. No oropharyngeal exudate.  Cardiovascular:     Rate and Rhythm: Normal rate and regular rhythm.     Heart sounds: No murmur heard.    No friction rub. No gallop.  Pulmonary:     Breath sounds: Normal breath sounds.  Chest:  Breasts:    Right: Absent. No swelling, bleeding, inverted nipple, mass, nipple discharge or skin change.     Left: No swelling, bleeding, inverted nipple, mass, nipple discharge or skin change.  Abdominal:     General: Bowel sounds are normal. There is no distension.     Palpations: Abdomen is soft. There is no mass.     Tenderness: There is no abdominal tenderness.  Musculoskeletal:        General: No tenderness.      Cervical back: Normal range of motion and neck supple.     Right lower leg: No edema.     Left lower leg: No edema.  Lymphadenopathy:     Cervical: No cervical adenopathy.     Right cervical: No superficial, deep or posterior cervical adenopathy.    Left cervical: No superficial, deep or posterior cervical adenopathy.     Upper Body:     Right upper body: No supraclavicular or axillary adenopathy.     Left upper body: No supraclavicular or axillary adenopathy.     Lower Body: No right inguinal adenopathy. No left inguinal adenopathy.  Skin:    Coloration: Skin is not jaundiced.     Findings: No lesion or rash.  Neurological:     General: No focal deficit present.     Mental Status: She is alert and oriented to person, place, and time. Mental status is at baseline.  Psychiatric:        Mood and Affect: Mood normal.        Behavior: Behavior normal.        Thought Content: Thought content normal.        Judgment: Judgment normal.    LABS:      Latest Ref Rng & Units 02/20/2022   12:00 AM 10/20/2021   12:00 AM 09/22/2021   12:00  AM  CBC  WBC  4.5     5.7     7.1      Hemoglobin 12.0 - 16.0 13.9     11.4     11.6      Hematocrit 36 - 46 41     36     36      Platelets 150 - 400 K/uL 196     392     388         This result is from an external source.      Latest Ref Rng & Units 02/20/2022   12:00 AM 10/20/2021   12:00 AM 09/22/2021   12:00 AM  CMP  BUN 4 - '21 7     5     5      '$ Creatinine 0.5 - 1.1 0.6     0.5     0.5      Sodium 137 - 147 138     137     135      Potassium 3.5 - 5.1 mEq/L 3.8     3.8     3.3      Chloride 99 - 108 103     102     102      CO2 13 - '22 30     30     27      '$ Calcium 8.7 - 10.7 9.5     9.1     8.6      Total Protein 6.3 - 8.2 g/dL  6.2       Alkaline Phos 25 - 125 107     74     76      AST 13 - 35 20     36     22      ALT 7 - 35 U/L '18     16     17         '$ This result is from an external source.   ASSESSMENT & PLAN:  A 61 y.o. 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  She completed her 4 cycles of adjuvant Taxotere/Cytoxan in March 2023, which was followed by adjuvant chest wall radiation.  Based upon her clinical breast exam today, she remains disease-free.  She knows to continue taking her letrozole on a daily basis to complete 5 total years of endocrine therapy.  Clinically, the patient is doing well.  I will see her back in another 4 months for a repeat clinical breast exam.  Her annual mammogram will be scheduled before her next visit for her continued radiographic breast cancer surveillance.  A bone density study will also be done to ensure her letrozole is not leading to any significant bone loss.  The patient understands all the plans discussed today and is in agreement with them.     Shirlyn Savin Macarthur Critchley, MD

## 2022-02-20 ENCOUNTER — Other Ambulatory Visit: Payer: Self-pay | Admitting: Oncology

## 2022-02-20 ENCOUNTER — Inpatient Hospital Stay: Payer: Medicare Other

## 2022-02-20 ENCOUNTER — Inpatient Hospital Stay: Payer: Medicare Other | Attending: Oncology | Admitting: Oncology

## 2022-02-20 VITALS — BP 131/85 | HR 102 | Temp 98.7°F | Resp 16 | Ht 62.0 in | Wt 178.5 lb

## 2022-02-20 DIAGNOSIS — C50411 Malignant neoplasm of upper-outer quadrant of right female breast: Secondary | ICD-10-CM

## 2022-02-20 DIAGNOSIS — Z170 Estrogen receptor positive status [ER+]: Secondary | ICD-10-CM

## 2022-02-20 LAB — BASIC METABOLIC PANEL
BUN: 7 (ref 4–21)
CO2: 30 — AB (ref 13–22)
Chloride: 103 (ref 99–108)
Creatinine: 0.6 (ref 0.5–1.1)
Glucose: 127
Potassium: 3.8 mEq/L (ref 3.5–5.1)
Sodium: 138 (ref 137–147)

## 2022-02-20 LAB — HEPATIC FUNCTION PANEL
ALT: 18 U/L (ref 7–35)
AST: 20 (ref 13–35)
Alkaline Phosphatase: 107 (ref 25–125)
Bilirubin, Total: 0.6

## 2022-02-20 LAB — CBC AND DIFFERENTIAL
HCT: 41 (ref 36–46)
Hemoglobin: 13.9 (ref 12.0–16.0)
Neutrophils Absolute: 2.7
Platelets: 196 10*3/uL (ref 150–400)
WBC: 4.5

## 2022-02-20 LAB — COMPREHENSIVE METABOLIC PANEL
Albumin: 4.1 (ref 3.5–5.0)
Calcium: 9.5 (ref 8.7–10.7)

## 2022-02-20 LAB — CBC: RBC: 4.27 (ref 3.87–5.11)

## 2022-02-20 NOTE — Progress Notes (Signed)
Face to face with pt in [place]. Pt has completed chemo and radiation and is now taking an aromatase inhibitor and tolerating without problems. Pt is here today for a follow up appt with Dr. Bobby Rumpf.

## 2022-02-21 ENCOUNTER — Other Ambulatory Visit: Payer: Self-pay

## 2022-02-25 ENCOUNTER — Other Ambulatory Visit: Payer: Self-pay

## 2022-02-28 NOTE — Progress Notes (Signed)
Closing pt to navigation services. Pt has completed all phases of treatment accept for the aromatase inhibitor which she is taking without difficulty.

## 2022-03-03 ENCOUNTER — Other Ambulatory Visit: Payer: Self-pay | Admitting: Oncology

## 2022-05-07 ENCOUNTER — Encounter: Payer: Self-pay | Admitting: Oncology

## 2022-06-22 ENCOUNTER — Telehealth: Payer: Self-pay | Admitting: Oncology

## 2022-06-22 ENCOUNTER — Inpatient Hospital Stay: Payer: Medicare Other | Attending: Oncology | Admitting: Oncology

## 2022-06-22 ENCOUNTER — Inpatient Hospital Stay: Payer: Medicare Other

## 2022-06-22 VITALS — BP 141/83 | HR 110 | Temp 98.8°F | Resp 16 | Ht 62.0 in | Wt 184.8 lb

## 2022-06-22 DIAGNOSIS — Z79811 Long term (current) use of aromatase inhibitors: Secondary | ICD-10-CM | POA: Insufficient documentation

## 2022-06-22 DIAGNOSIS — M8580 Other specified disorders of bone density and structure, unspecified site: Secondary | ICD-10-CM | POA: Diagnosis not present

## 2022-06-22 DIAGNOSIS — Z170 Estrogen receptor positive status [ER+]: Secondary | ICD-10-CM | POA: Insufficient documentation

## 2022-06-22 DIAGNOSIS — C50911 Malignant neoplasm of unspecified site of right female breast: Secondary | ICD-10-CM | POA: Diagnosis present

## 2022-06-22 DIAGNOSIS — Z923 Personal history of irradiation: Secondary | ICD-10-CM | POA: Insufficient documentation

## 2022-06-22 DIAGNOSIS — C50411 Malignant neoplasm of upper-outer quadrant of right female breast: Secondary | ICD-10-CM | POA: Diagnosis not present

## 2022-06-22 DIAGNOSIS — Z9011 Acquired absence of right breast and nipple: Secondary | ICD-10-CM | POA: Diagnosis not present

## 2022-06-22 LAB — CBC WITH DIFFERENTIAL (CANCER CENTER ONLY)
Abs Immature Granulocytes: 0.01 10*3/uL (ref 0.00–0.07)
Basophils Absolute: 0 10*3/uL (ref 0.0–0.1)
Basophils Relative: 1 %
Eosinophils Absolute: 0.1 10*3/uL (ref 0.0–0.5)
Eosinophils Relative: 2 %
HCT: 41.3 % (ref 36.0–46.0)
Hemoglobin: 13.5 g/dL (ref 12.0–15.0)
Immature Granulocytes: 0 %
Lymphocytes Relative: 21 %
Lymphs Abs: 1.2 10*3/uL (ref 0.7–4.0)
MCH: 32.8 pg (ref 26.0–34.0)
MCHC: 32.7 g/dL (ref 30.0–36.0)
MCV: 100.5 fL — ABNORMAL HIGH (ref 80.0–100.0)
Monocytes Absolute: 0.5 10*3/uL (ref 0.1–1.0)
Monocytes Relative: 8 %
Neutro Abs: 4 10*3/uL (ref 1.7–7.7)
Neutrophils Relative %: 68 %
Platelet Count: 198 10*3/uL (ref 150–400)
RBC: 4.11 MIL/uL (ref 3.87–5.11)
RDW: 12.3 % (ref 11.5–15.5)
WBC Count: 5.9 10*3/uL (ref 4.0–10.5)
nRBC: 0 % (ref 0.0–0.2)

## 2022-06-22 NOTE — Progress Notes (Signed)
[facility]  [address] [place],  [place]  [address] [phone]  Clinic Day:  06/22/2022  Referring physician: Laverle Hobby, NP  HISTORY OF PRESENT ILLNESS:  The patient is a 61 y.o. 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  She completed her 4 cycles of adjuvant Taxotere/Cytoxan in March 2023.  She also completed adjuvant breast radiation.  The patient is currently taking letrozole on a daily basis for her adjuvant endocrine therapy.  She comes in today for routine follow-up.  Since her last visit, the patient has been doing well.  She denies having any changes with her breast/chest wall region which concern her for early disease recurrence.  Of note, her annual mammogram in October 2023 showed no evidence of disease recurrence.  She also had a bone density study which showed osteopenia.    PHYSICAL EXAM:  Blood pressure (!) 141/83, pulse (!) 110, temperature 98.8 F (37.1 C), resp. rate 16, height '5\' 2"'$  (1.575 m), weight 184 lb 12.8 oz (83.8 kg), SpO2 96 %. Wt Readings from Last 3 Encounters:  06/22/22 184 lb 12.8 oz (83.8 kg)  02/20/22 178 lb 8 oz (81 kg)  10/20/21 185 lb 3.2 oz (84 kg)   Body mass index is 33.8 kg/m. Performance status (ECOG): 0 - Asymptomatic Physical Exam Constitutional:      Appearance: Normal appearance.  HENT:     Mouth/Throat:     Pharynx: Oropharynx is clear. No oropharyngeal exudate.  Cardiovascular:     Rate and Rhythm: Normal rate and regular rhythm.     Heart sounds: No murmur heard.    No friction rub. No gallop.  Pulmonary:     Breath sounds: Normal breath sounds.  Chest:  Breasts:    Right: Absent. No swelling, bleeding, inverted nipple, mass, nipple discharge or skin change.     Left: No swelling, bleeding, inverted nipple, mass, nipple discharge or skin change.  Abdominal:     General: Bowel sounds are normal. There is no  distension.     Palpations: Abdomen is soft. There is no mass.     Tenderness: There is no abdominal tenderness.  Musculoskeletal:        General: No tenderness.     Cervical back: Normal range of motion and neck supple.     Right lower leg: No edema.     Left lower leg: No edema.  Lymphadenopathy:     Cervical: No cervical adenopathy.     Right cervical: No superficial, deep or posterior cervical adenopathy.    Left cervical: No superficial, deep or posterior cervical adenopathy.     Upper Body:     Right upper body: No supraclavicular or axillary adenopathy.     Left upper body: No supraclavicular or axillary adenopathy.     Lower Body: No right inguinal adenopathy. No left inguinal adenopathy.  Skin:    Coloration: Skin is not jaundiced.     Findings: No lesion or rash.  Neurological:     General: No focal deficit present.     Mental Status: She is alert and oriented to person, place, and time. Mental status is at baseline.  Psychiatric:        Mood and Affect: Mood normal.        Behavior: Behavior normal.        Thought Content: Thought content normal.        Judgment: Judgment  normal.    LABS:      Latest Ref Rng & Units 06/22/2022    1:06 PM 02/20/2022   12:00 AM 10/20/2021   12:00 AM  CBC  WBC 4.0 - 10.5 K/uL 5.9  4.5     5.7      Hemoglobin 12.0 - 15.0 g/dL 13.5  13.9     11.4      Hematocrit 36.0 - 46.0 % 41.3  41     36      Platelets 150 - 400 K/uL 198  196     392         This result is from an external source.      Latest Ref Rng & Units 02/20/2022   12:00 AM 10/20/2021   12:00 AM 09/22/2021   12:00 AM  CMP  BUN 4 - '21 7     5     5      '$ Creatinine 0.5 - 1.1 0.6     0.5     0.5      Sodium 137 - 147 138     137     135      Potassium 3.5 - 5.1 mEq/L 3.8     3.8     3.3      Chloride 99 - 108 103     102     102      CO2 13 - '22 30     30     27      '$ Calcium 8.7 - 10.7 9.5     9.1     8.6      Total Protein 6.3 - 8.2 g/dL  6.2       Alkaline Phos 25 -  125 107     74     76      AST 13 - 35 20     36     22      ALT 7 - 35 U/L '18     16     17         '$ This result is from an external source.   ASSESSMENT & PLAN:  A 62 y.o. 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  She completed her 4 cycles of adjuvant Taxotere/Cytoxan in March 2023, which was followed by adjuvant chest wall radiation.  Based upon her clinical breast exam today and recent mammogram, the patient remains disease-free.  She knows to continue taking her letrozole on a daily basis to complete 5 total years of endocrine therapy.  She also knows to take a daily allotment of calcium and vitamin D for her bone health.  Clinically, the patient is doing well.  I will see her back in another 4 months for a repeat clinical breast exam.  The patient understands all the plans discussed today and is in agreement with them.    Audrianna Driskill Macarthur Critchley, MD

## 2022-06-22 NOTE — Telephone Encounter (Signed)
06/22/22 Next appt scheduled and confirmed with patient

## 2022-07-08 ENCOUNTER — Encounter: Payer: Self-pay | Admitting: Oncology

## 2022-07-25 ENCOUNTER — Other Ambulatory Visit: Payer: Self-pay | Admitting: Oncology

## 2022-10-21 NOTE — Progress Notes (Signed)
[facility]  [address] [place],  [place]  [address] [phone]  Clinic Day:  10/22/2022  Referring physician: Joaquin Music, NP  HISTORY OF PRESENT ILLNESS:  The patient is a 62 y.o. 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  She completed her 4 cycles of adjuvant Taxotere/Cytoxan in March 2023.  She also completed adjuvant breast radiation.  The patient is currently taking letrozole on a daily basis for her adjuvant endocrine therapy.  She comes in today for routine follow-up.  Since her last visit, the patient has been doing well.  She denies having any changes with her breast/chest wall region which concern her for early disease recurrence.   PHYSICAL EXAM:  Blood pressure (!) 149/91, pulse 91, temperature 97.8 F (36.6 C), resp. rate 16, height 5\' 2"  (1.575 m), weight 187 lb 6.4 oz (85 kg), SpO2 96 %. Wt Readings from Last 3 Encounters:  10/22/22 187 lb 6.4 oz (85 kg)  06/22/22 184 lb 12.8 oz (83.8 kg)  02/20/22 178 lb 8 oz (81 kg)   Body mass index is 34.28 kg/m. Performance status (ECOG): 0 - Asymptomatic Physical Exam Constitutional:      Appearance: Normal appearance.  HENT:     Mouth/Throat:     Pharynx: Oropharynx is clear. No oropharyngeal exudate.  Cardiovascular:     Rate and Rhythm: Normal rate and regular rhythm.     Heart sounds: No murmur heard.    No friction rub. No gallop.  Pulmonary:     Breath sounds: Normal breath sounds.  Chest:  Breasts:    Right: Absent. No swelling, bleeding, inverted nipple, mass, nipple discharge or skin change.     Left: No swelling, bleeding, inverted nipple, mass, nipple discharge or skin change.  Abdominal:     General: Bowel sounds are normal. There is no distension.     Palpations: Abdomen is soft. There is no mass.     Tenderness: There is no abdominal tenderness.  Musculoskeletal:        General: No  tenderness.     Cervical back: Normal range of motion and neck supple.     Right lower leg: No edema.     Left lower leg: No edema.  Lymphadenopathy:     Cervical: No cervical adenopathy.     Right cervical: No superficial, deep or posterior cervical adenopathy.    Left cervical: No superficial, deep or posterior cervical adenopathy.     Upper Body:     Right upper body: No supraclavicular or axillary adenopathy.     Left upper body: No supraclavicular or axillary adenopathy.     Lower Body: No right inguinal adenopathy. No left inguinal adenopathy.  Skin:    Coloration: Skin is not jaundiced.     Findings: No lesion or rash.  Neurological:     General: No focal deficit present.     Mental Status: She is alert and oriented to person, place, and time. Mental status is at baseline.  Psychiatric:        Mood and Affect: Mood normal.        Behavior: Behavior normal.        Thought Content: Thought content normal.        Judgment: Judgment normal.    LABS:      Latest Ref Rng & Units 06/22/2022    1:06 PM 02/20/2022   12:00 AM 10/20/2021  12:00 AM  CBC  WBC 4.0 - 10.5 K/uL 5.9  4.5     5.7      Hemoglobin 12.0 - 15.0 g/dL 16.1  09.6     04.5      Hematocrit 36.0 - 46.0 % 41.3  41     36      Platelets 150 - 400 K/uL 198  196     392         This result is from an external source.      Latest Ref Rng & Units 02/20/2022   12:00 AM 10/20/2021   12:00 AM 09/22/2021   12:00 AM  CMP  BUN 4 - 21 7     5     5       Creatinine 0.5 - 1.1 0.6     0.5     0.5      Sodium 137 - 147 138     137     135      Potassium 3.5 - 5.1 mEq/L 3.8     3.8     3.3      Chloride 99 - 108 103     102     102      CO2 13 - 22 30     30     27       Calcium 8.7 - 10.7 9.5     9.1     8.6      Total Protein 6.3 - 8.2 g/dL  6.2       Alkaline Phos 25 - 125 107     74     76      AST 13 - 35 20     36     22      ALT 7 - 35 U/L 18     16     17          This result is from an external source.    ASSESSMENT & PLAN:  A 62 y.o. 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  She completed her 4 cycles of adjuvant Taxotere/Cytoxan in March 2023, which was followed by adjuvant chest wall radiation.  Based upon her clinical breast exam today, the patient remains disease-free.  She knows to continue taking her letrozole on a daily basis to complete 5 total years of endocrine therapy.  She also knows to take her daily allotment of calcium and vitamin D for her bone health.  Clinically, the patient is doing well.  I will see her back in another 4 months for a repeat clinical breast exam.  The patient understands all the plans discussed today and is in agreement with them.    Harman Langhans Kirby Funk, MD

## 2022-10-22 ENCOUNTER — Inpatient Hospital Stay: Payer: Medicare Other | Attending: Oncology | Admitting: Oncology

## 2022-10-22 ENCOUNTER — Inpatient Hospital Stay: Payer: Medicare Other

## 2022-10-22 ENCOUNTER — Telehealth: Payer: Self-pay | Admitting: Oncology

## 2022-10-22 VITALS — BP 149/91 | HR 91 | Temp 97.8°F | Resp 16 | Ht 62.0 in | Wt 187.4 lb

## 2022-10-22 DIAGNOSIS — C50411 Malignant neoplasm of upper-outer quadrant of right female breast: Secondary | ICD-10-CM | POA: Diagnosis not present

## 2022-10-22 DIAGNOSIS — Z170 Estrogen receptor positive status [ER+]: Secondary | ICD-10-CM | POA: Diagnosis not present

## 2022-10-22 NOTE — Telephone Encounter (Signed)
Patient has been scheduled for follow-up visit per 10/22/22 LOS.  Pt given an appt calendar with date and time.

## 2022-12-01 ENCOUNTER — Encounter: Payer: Self-pay | Admitting: Oncology

## 2023-02-21 ENCOUNTER — Inpatient Hospital Stay: Payer: Medicare Other

## 2023-02-21 ENCOUNTER — Inpatient Hospital Stay: Payer: Medicare Other | Attending: Oncology | Admitting: Oncology

## 2023-02-21 VITALS — BP 182/110 | HR 105 | Temp 98.2°F | Resp 16 | Ht 62.0 in | Wt 193.1 lb

## 2023-02-21 DIAGNOSIS — Z170 Estrogen receptor positive status [ER+]: Secondary | ICD-10-CM

## 2023-02-21 DIAGNOSIS — C50411 Malignant neoplasm of upper-outer quadrant of right female breast: Secondary | ICD-10-CM

## 2023-02-21 DIAGNOSIS — C50911 Malignant neoplasm of unspecified site of right female breast: Secondary | ICD-10-CM | POA: Insufficient documentation

## 2023-02-21 DIAGNOSIS — Z79811 Long term (current) use of aromatase inhibitors: Secondary | ICD-10-CM | POA: Insufficient documentation

## 2023-02-21 DIAGNOSIS — Z9011 Acquired absence of right breast and nipple: Secondary | ICD-10-CM | POA: Diagnosis not present

## 2023-02-21 DIAGNOSIS — Z923 Personal history of irradiation: Secondary | ICD-10-CM | POA: Diagnosis not present

## 2023-02-21 LAB — CMP (CANCER CENTER ONLY)
ALT: 23 U/L (ref 0–44)
AST: 19 U/L (ref 15–41)
Albumin: 3.9 g/dL (ref 3.5–5.0)
Alkaline Phosphatase: 109 U/L (ref 38–126)
Anion gap: 9 (ref 5–15)
BUN: 9 mg/dL (ref 8–23)
CO2: 28 mmol/L (ref 22–32)
Calcium: 9.4 mg/dL (ref 8.9–10.3)
Chloride: 100 mmol/L (ref 98–111)
Creatinine: 0.82 mg/dL (ref 0.44–1.00)
GFR, Estimated: >60 mL/min (ref >60)
Glucose, Bld: 129 mg/dL — ABNORMAL HIGH (ref 70–99)
Potassium: 3.5 mmol/L (ref 3.5–5.1)
Sodium: 137 mmol/L (ref 135–145)
Total Bilirubin: 0.5 mg/dL (ref 0.3–1.2)
Total Protein: 7.7 g/dL (ref 6.5–8.1)

## 2023-02-21 LAB — CBC WITH DIFFERENTIAL (CANCER CENTER ONLY)
Abs Immature Granulocytes: 0.01 10*3/uL (ref 0.00–0.07)
Basophils Absolute: 0.1 10*3/uL (ref 0.0–0.1)
Basophils Relative: 1 %
Eosinophils Absolute: 0.1 10*3/uL (ref 0.0–0.5)
Eosinophils Relative: 2 %
HCT: 42.9 % (ref 36.0–46.0)
Hemoglobin: 14.3 g/dL (ref 12.0–15.0)
Immature Granulocytes: 0 %
Lymphocytes Relative: 25 %
Lymphs Abs: 1.4 10*3/uL (ref 0.7–4.0)
MCH: 32.9 pg (ref 26.0–34.0)
MCHC: 33.3 g/dL (ref 30.0–36.0)
MCV: 98.6 fL (ref 80.0–100.0)
Monocytes Absolute: 0.5 10*3/uL (ref 0.1–1.0)
Monocytes Relative: 10 %
Neutro Abs: 3.4 10*3/uL (ref 1.7–7.7)
Neutrophils Relative %: 62 %
Platelet Count: 204 10*3/uL (ref 150–400)
RBC: 4.35 MIL/uL (ref 3.87–5.11)
RDW: 11.9 % (ref 11.5–15.5)
WBC Count: 5.5 10*3/uL (ref 4.0–10.5)
nRBC: 0 % (ref 0.0–0.2)

## 2023-02-21 NOTE — Progress Notes (Unsigned)
[facility]  [address] [place],  [place]  [address] [phone]  Clinic Day:  02/22/2023  Referring physician: Joaquin Music, NP  HISTORY OF PRESENT ILLNESS:  The patient is a 62 y.o. 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  She completed her 4 cycles of adjuvant Taxotere/Cytoxan in March 2023.  She also completed adjuvant breast radiation.  The patient is currently taking letrozole on a daily basis for her adjuvant endocrine therapy.  She comes in today for routine follow-up.  Since her last visit, the patient has been doing well.  She denies having any changes with her breast/chest wall region which concern her for early disease recurrence.   PHYSICAL EXAM:  Blood pressure (!) 182/110, pulse (!) 105, temperature 98.2 F (36.8 C), resp. rate 16, height 5\' 2"  (1.575 m), weight 193 lb 1.6 oz (87.6 kg), SpO2 95%. Wt Readings from Last 3 Encounters:  02/21/23 193 lb 1.6 oz (87.6 kg)  10/22/22 187 lb 6.4 oz (85 kg)  06/22/22 184 lb 12.8 oz (83.8 kg)   Body mass index is 35.32 kg/m. Performance status (ECOG): 0 - Asymptomatic Physical Exam Constitutional:      Appearance: Normal appearance.  HENT:     Mouth/Throat:     Pharynx: Oropharynx is clear. No oropharyngeal exudate.  Cardiovascular:     Rate and Rhythm: Normal rate and regular rhythm.     Heart sounds: No murmur heard.    No friction rub. No gallop.  Pulmonary:     Breath sounds: Normal breath sounds.  Chest:  Breasts:    Right: Absent. No swelling, bleeding, inverted nipple, mass, nipple discharge or skin change.     Left: No swelling, bleeding, inverted nipple, mass, nipple discharge or skin change.  Abdominal:     General: Bowel sounds are normal. There is no distension.     Palpations: Abdomen is soft. There is no mass.     Tenderness: There is no abdominal tenderness.  Musculoskeletal:        General:  No tenderness.     Cervical back: Normal range of motion and neck supple.     Right lower leg: No edema.     Left lower leg: No edema.  Lymphadenopathy:     Cervical: No cervical adenopathy.     Right cervical: No superficial, deep or posterior cervical adenopathy.    Left cervical: No superficial, deep or posterior cervical adenopathy.     Upper Body:     Right upper body: No supraclavicular or axillary adenopathy.     Left upper body: No supraclavicular or axillary adenopathy.     Lower Body: No right inguinal adenopathy. No left inguinal adenopathy.  Skin:    Coloration: Skin is not jaundiced.     Findings: No lesion or rash.  Neurological:     General: No focal deficit present.     Mental Status: She is alert and oriented to person, place, and time. Mental status is at baseline.  Psychiatric:        Mood and Affect: Mood normal.        Behavior: Behavior normal.        Thought Content: Thought content normal.        Judgment: Judgment normal.    ASSESSMENT & PLAN:  A 62 y.o. 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  She completed her 4 cycles of adjuvant Taxotere/Cytoxan in March 2023, which was followed by adjuvant chest wall radiation.  Based upon her clinical breast exam today, the patient remains disease-free.  She knows to continue taking her letrozole on a daily basis to complete 5 total years of endocrine therapy.  She also knows to take her daily allotment of calcium and vitamin D for her bone health.  Clinically, the patient is doing well.  I will see her back in another 4 months for a repeat clinical breast exam. Her annual mammogram will also be scheduled before her next visit for her continued radiographic breast cancer surveillance.  If her next mammogram and clinical breast exam both come back normal, I will begin spacing all future appointments out to every 6 months.  The patient understands all the  plans discussed today and is in agreement with them.    Jamiaya Bina Kirby Funk, MD

## 2023-02-22 ENCOUNTER — Telehealth: Payer: Self-pay | Admitting: Oncology

## 2023-02-22 NOTE — Telephone Encounter (Signed)
Patient has been scheduled. Aware of appt date and time   Scheduling Message Entered by Rennis Harding A on 02/22/2023 at  1:19 AM Priority: Routine <No visit type provided>  Department: [facility]  Provider:  Scheduling Notes:  Appt 06-24-23

## 2023-02-24 ENCOUNTER — Encounter: Payer: Self-pay | Admitting: Oncology

## 2023-06-23 NOTE — Progress Notes (Unsigned)
[facility]  [address] [place],  [place]  [address] [phone]  Clinic Day:  02/21/2023  Referring physician: Joaquin Music, NP  HISTORY OF PRESENT ILLNESS:  The patient is a 63 y.o. 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  She completed her 4 cycles of adjuvant Taxotere/Cytoxan in March 2023.  She also completed adjuvant breast radiation.  The patient is currently taking letrozole on a daily basis for her adjuvant endocrine therapy.  She comes in today for routine follow-up.  Since her last visit, the patient has been doing well.  She denies having any changes with her breast/chest wall region which concern her for early disease recurrence.   Of note, her annual mammogram in October 2024 showed no evidence of disease recurrence.    PHYSICAL EXAM:  There were no vitals taken for this visit. Wt Readings from Last 3 Encounters:  02/21/23 193 lb 1.6 oz (87.6 kg)  10/22/22 187 lb 6.4 oz (85 kg)  06/22/22 184 lb 12.8 oz (83.8 kg)   There is no height or weight on file to calculate BMI. Performance status (ECOG): 0 - Asymptomatic Physical Exam Constitutional:      Appearance: Normal appearance.  HENT:     Mouth/Throat:     Pharynx: Oropharynx is clear. No oropharyngeal exudate.  Cardiovascular:     Rate and Rhythm: Normal rate and regular rhythm.     Heart sounds: No murmur heard.    No friction rub. No gallop.  Pulmonary:     Breath sounds: Normal breath sounds.  Chest:  Breasts:    Right: Absent. No swelling, bleeding, inverted nipple, mass, nipple discharge or skin change.     Left: No swelling, bleeding, inverted nipple, mass, nipple discharge or skin change.  Abdominal:     General: Bowel sounds are normal. There is no distension.     Palpations: Abdomen is soft. There is no mass.     Tenderness: There is no abdominal tenderness.  Musculoskeletal:        General:  No tenderness.     Cervical back: Normal range of motion and neck supple.     Right lower leg: No edema.     Left lower leg: No edema.  Lymphadenopathy:     Cervical: No cervical adenopathy.     Right cervical: No superficial, deep or posterior cervical adenopathy.    Left cervical: No superficial, deep or posterior cervical adenopathy.     Upper Body:     Right upper body: No supraclavicular or axillary adenopathy.     Left upper body: No supraclavicular or axillary adenopathy.     Lower Body: No right inguinal adenopathy. No left inguinal adenopathy.  Skin:    Coloration: Skin is not jaundiced.     Findings: No lesion or rash.  Neurological:     General: No focal deficit present.     Mental Status: She is alert and oriented to person, place, and time. Mental status is at baseline.  Psychiatric:        Mood and Affect: Mood normal.        Behavior: Behavior normal.        Thought Content: Thought content normal.        Judgment: Judgment normal.   ASSESSMENT & PLAN:  A 62 y.o. 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  She completed her 4 cycles of adjuvant Taxotere/Cytoxan in March 2023, which was followed by adjuvant chest wall radiation.  Based upon her clinical breast exam today, the patient remains disease-free.  She knows to continue taking her letrozole on a daily basis to complete 5 total years of endocrine therapy.  She also knows to take her daily allotment of calcium and vitamin D for her bone health.  Clinically, the patient is doing well.  I will see her back in another 4 months for a repeat clinical breast exam. Her annual mammogram will also be scheduled before her next visit for her continued radiographic breast cancer surveillance.  If her next mammogram and clinical breast exam both come back normal, I will begin spacing all future appointments out to every 6 months.  The patient understands all the  plans discussed today and is in agreement with them.    Draedyn Weidinger Kirby Funk, MD

## 2023-06-24 ENCOUNTER — Inpatient Hospital Stay: Payer: Medicare Other | Attending: Oncology | Admitting: Oncology

## 2023-06-24 VITALS — BP 127/91 | HR 107 | Temp 98.2°F | Resp 18 | Ht 62.0 in | Wt 193.7 lb

## 2023-06-24 DIAGNOSIS — C50911 Malignant neoplasm of unspecified site of right female breast: Secondary | ICD-10-CM | POA: Insufficient documentation

## 2023-06-24 DIAGNOSIS — Z170 Estrogen receptor positive status [ER+]: Secondary | ICD-10-CM | POA: Diagnosis not present

## 2023-06-24 DIAGNOSIS — Z79811 Long term (current) use of aromatase inhibitors: Secondary | ICD-10-CM | POA: Insufficient documentation

## 2023-06-24 DIAGNOSIS — C50411 Malignant neoplasm of upper-outer quadrant of right female breast: Secondary | ICD-10-CM | POA: Diagnosis not present

## 2023-07-02 ENCOUNTER — Encounter: Payer: Self-pay | Admitting: Oncology

## 2023-08-13 ENCOUNTER — Other Ambulatory Visit: Payer: Self-pay | Admitting: Hematology and Oncology

## 2023-08-13 DIAGNOSIS — Z170 Estrogen receptor positive status [ER+]: Secondary | ICD-10-CM

## 2023-08-21 IMAGING — MG MM BREAST LOCALIZATION CLIP
4 series · 4 of 12 positions shown · non-contrast
Comparison: Previous exam(s).

CLINICAL DATA: Patient is post MRI guided core needle biopsy of
cm linear non mass enhancement over the upper central left breast.

EXAM:
3D DIAGNOSTIC left MAMMOGRAM POST MRI BIOPSY

[L ML synth-2D]
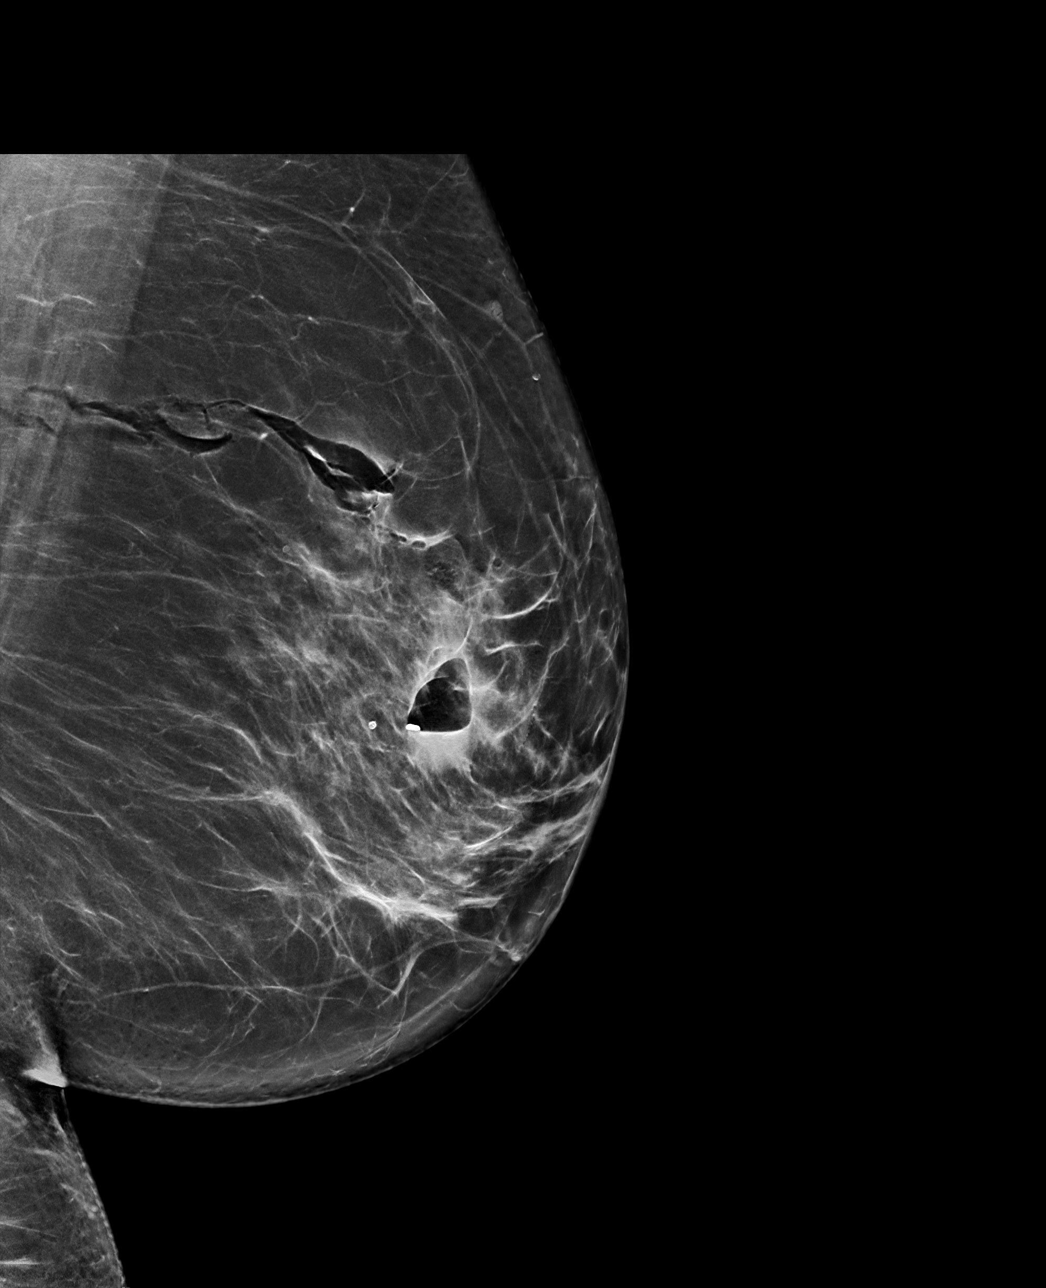

[L CC synth-2D]
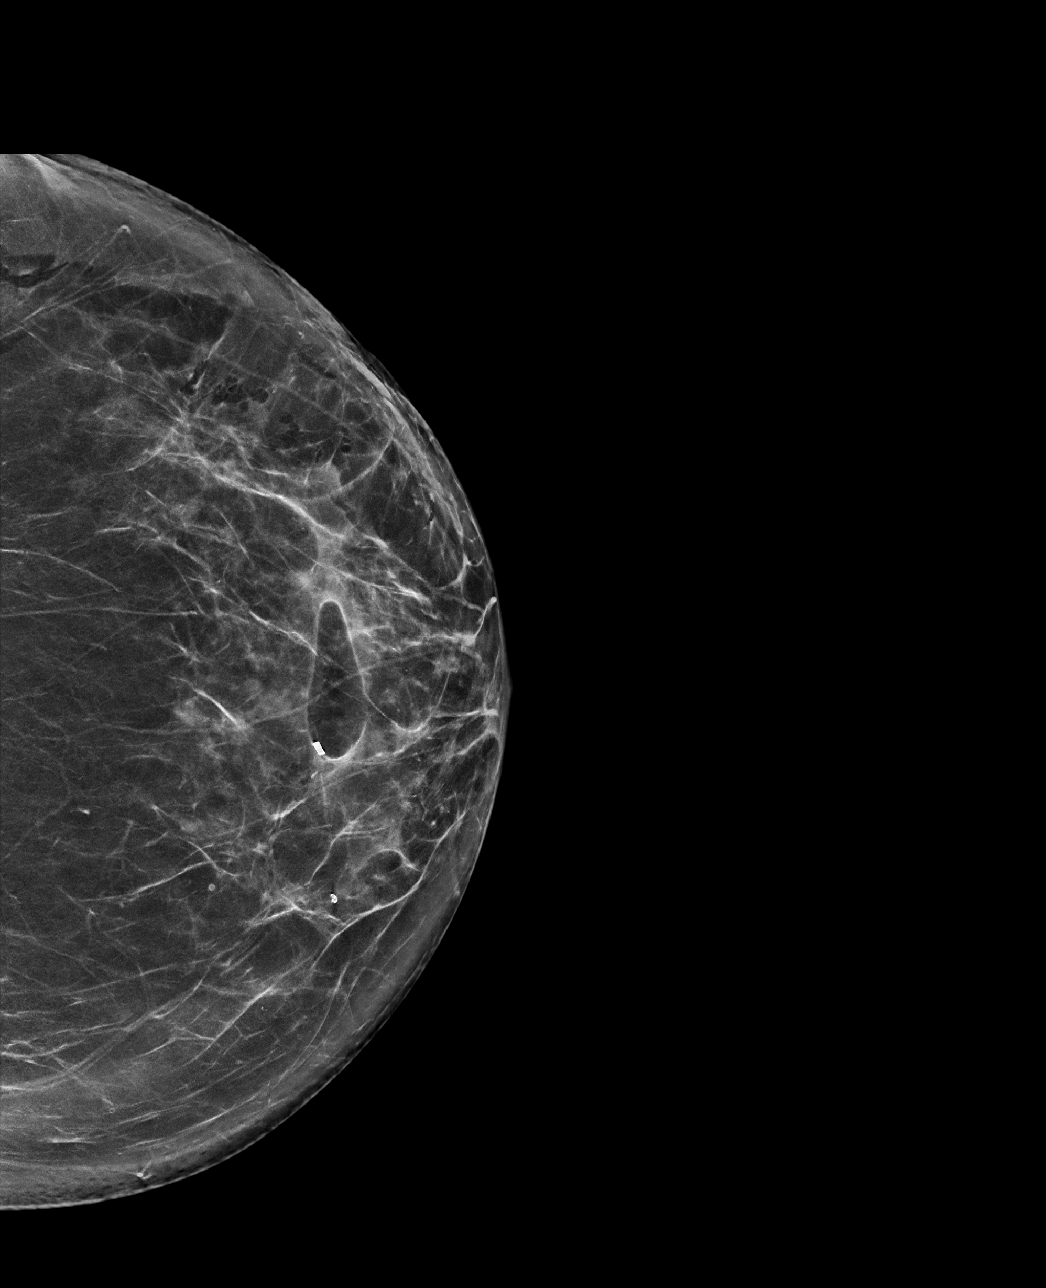

[L CC tomo · tomo slice 41/80.0]
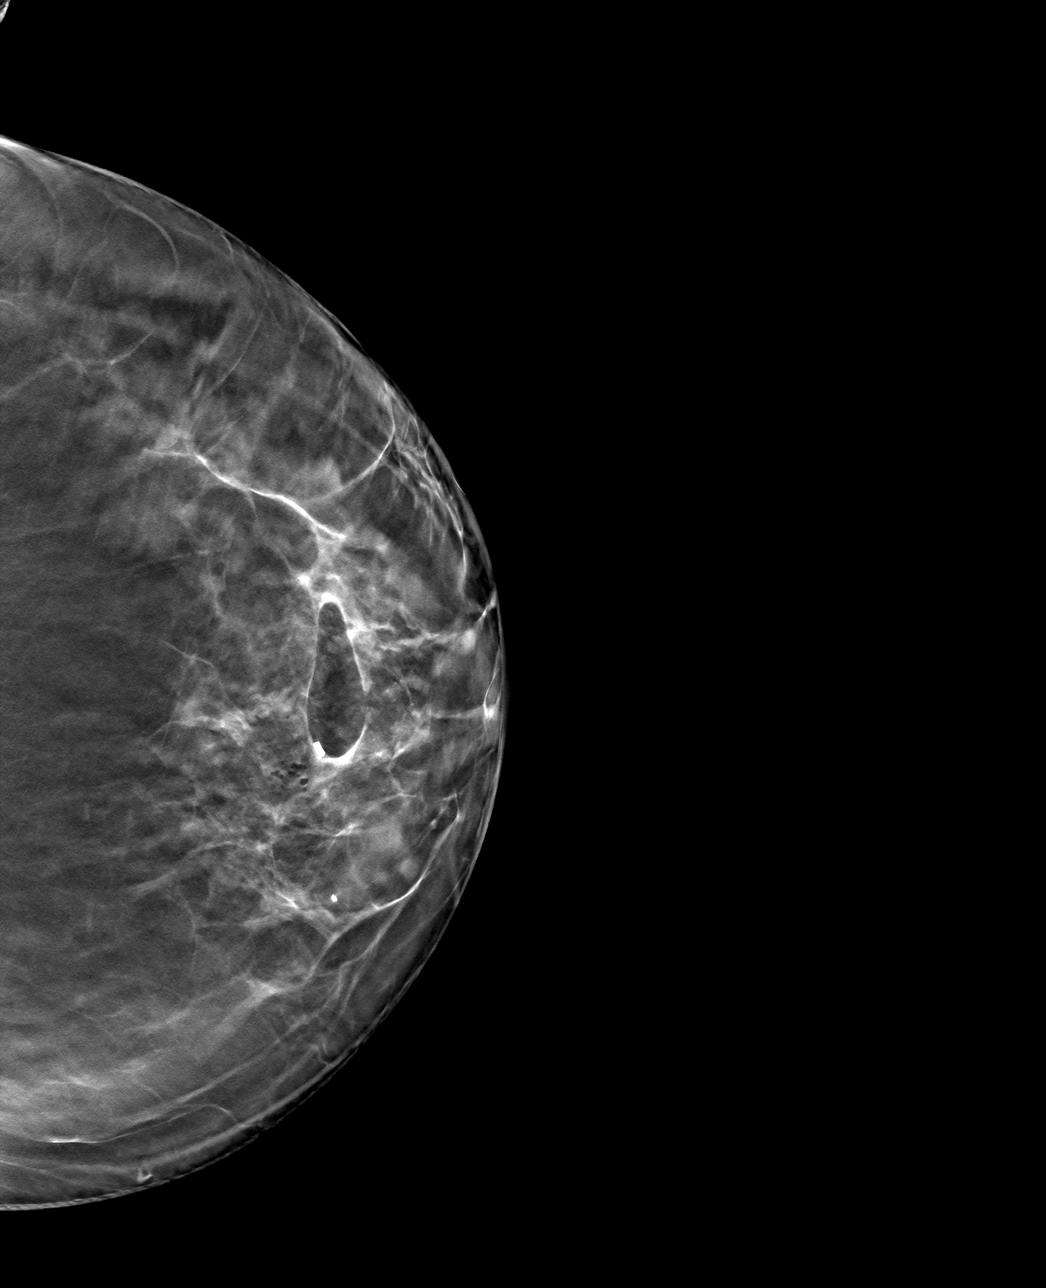

[L ML tomo · tomo slice 41/82.0]
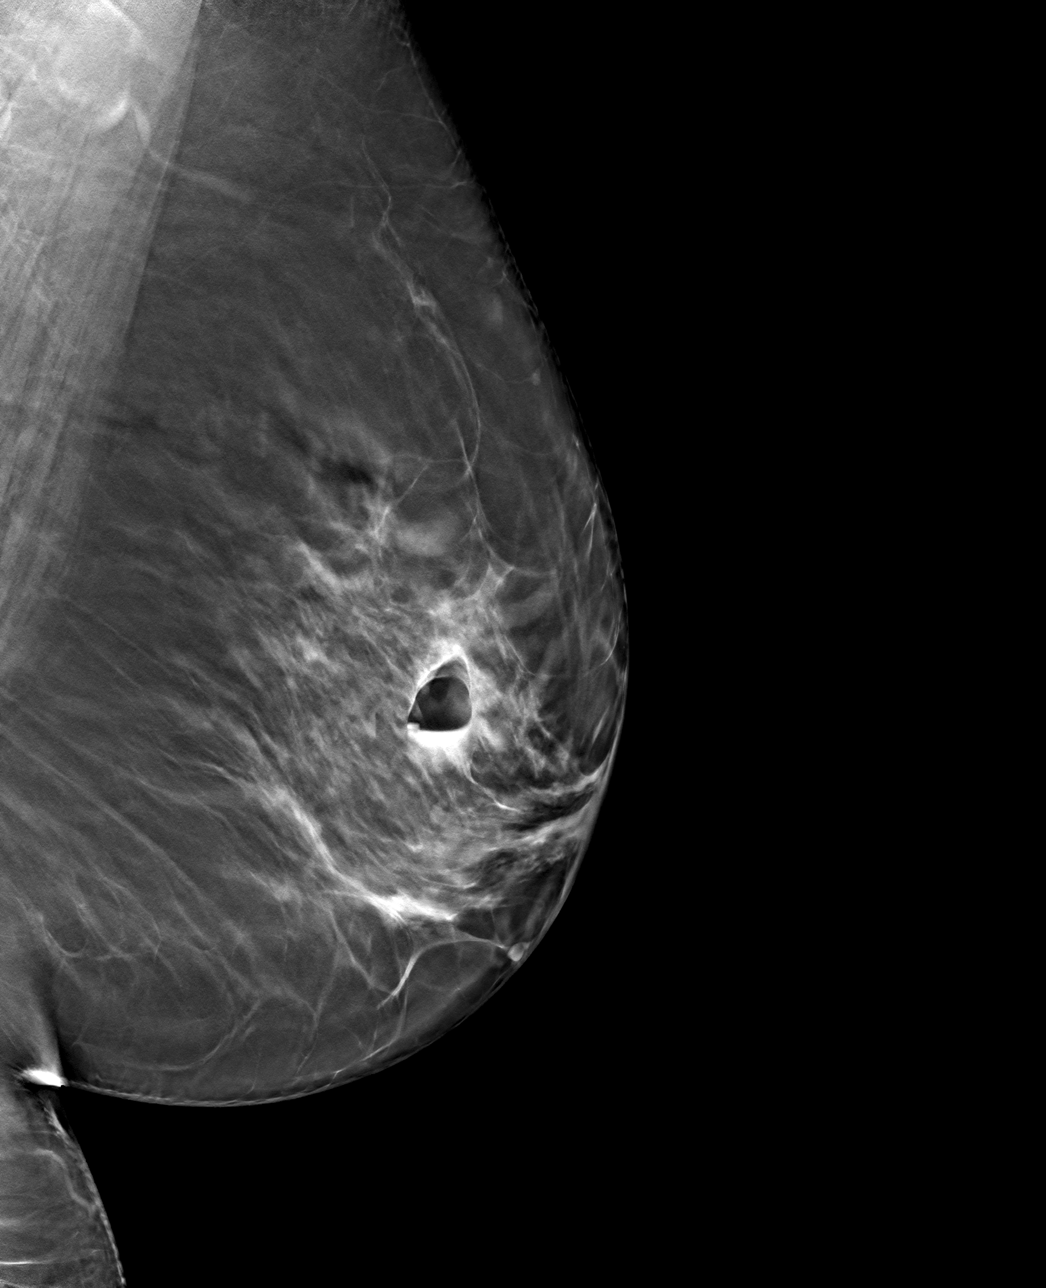

[4 of 12 positions shown; findings below may reference images not displayed]

FINDINGS: 3D Mammographic images were obtained following MRI guided biopsy of
the targeted linear non mass enhancement upper central left breast.
The biopsy marking clip is in expected position at the site of
biopsy.
IMPRESSION: Appropriate positioning of the cylinder shaped biopsy marking clip
at the site of biopsy in the upper central left breast.

Final Assessment: Post Procedure Mammograms for Marker Placement

## 2023-08-21 IMAGING — MR MR BREAST BX W LOC DEV 1ST LESION IMAGE BX SPEC MR GUIDE*L*
6 of 8 series · 32 of 48 positions shown · IV contrast (10 ml gadavist)
Comparison: Previous exams.
COMPARISON: Previous exams.

Addendum:
CLINICAL DATA: Patient presents for MRI guided biopsy of 1.4 cm
linear non mass enhancement over the upper central left breast.
Recent diagnosis right breast cancer.

EXAM:
MRI GUIDED CORE NEEDLE BIOPSY OF THE LEFT BREAST
TECHNIQUE: Multiplanar, multisequence MR imaging of the left breast was
performed both before and after administration of intravenous
contrast.
CONTRAST:  10mL GADAVIST GADOBUTROL 1 MMOL/ML IV SOLN

[Series 3: fiducial unilateral · sagittal · 2.0mm · 1.33mm/px · 1 of 52 slices shown]
[im 1/52]
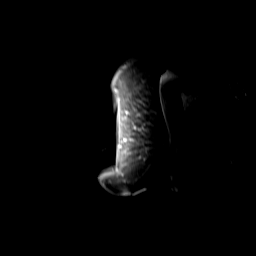

[Series 4: dynamic pre · axial · non-contrast · 1.3mm · 0.73mm/px · z∈[-116,+112]mm · 6 of 176 slices shown]
[im 1/176]
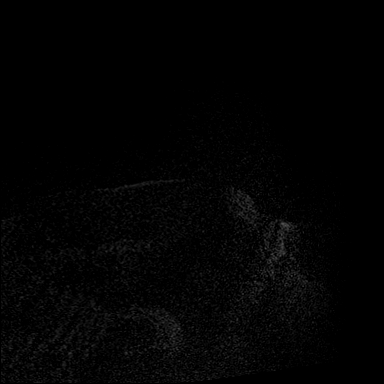
[im 36/176]
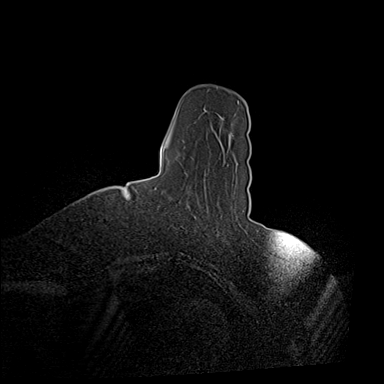
[im 71/176]
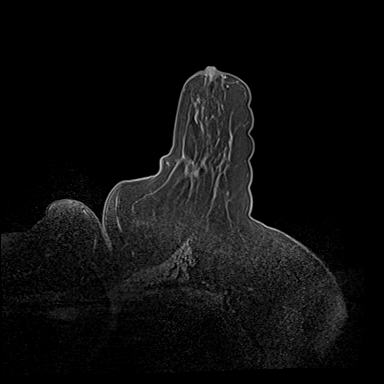
[im 106/176]
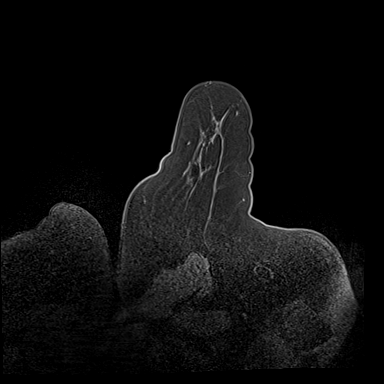
[im 141/176]
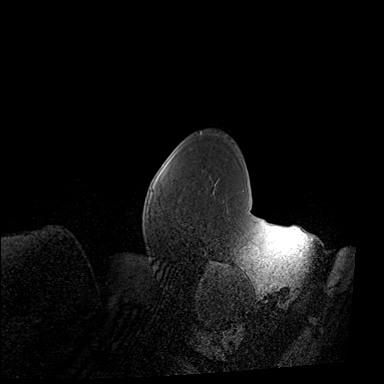
[im 176/176]
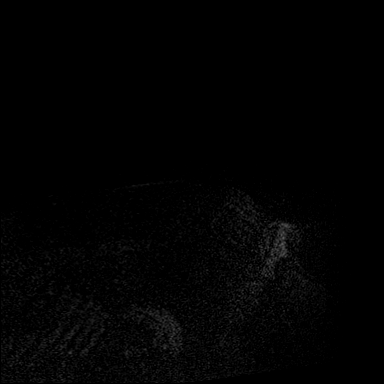

[Series 5: dynamic post 20 · axial · 1.3mm · 0.73mm/px · z∈[-116,+112]mm · 6 of 176 slices shown (1 of 2)]
[im 1/176]
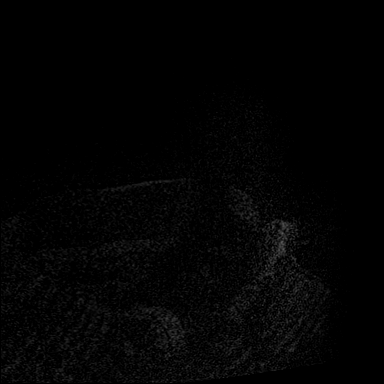
[im 36/176]
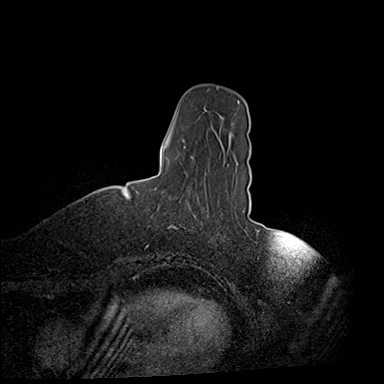
[im 71/176]
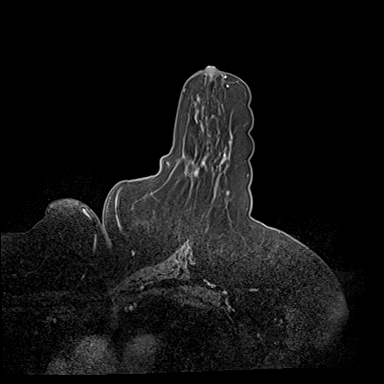
[im 106/176]
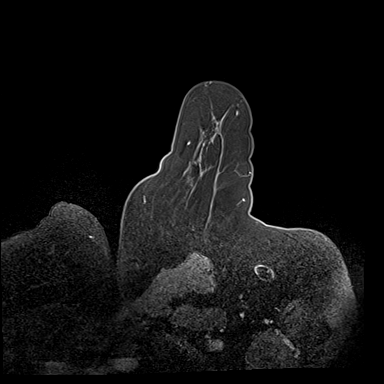
[im 141/176]
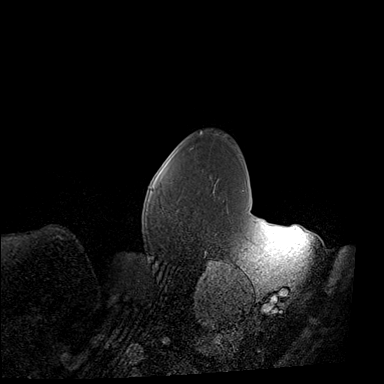
[im 176/176]
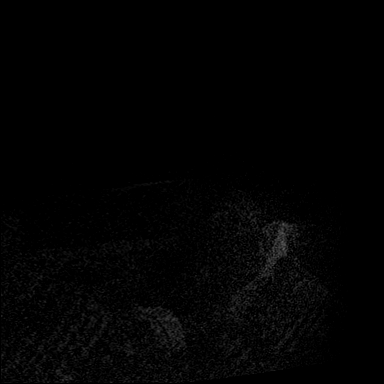

[Series 6: dynamic post 20 · axial · 1.3mm · 0.73mm/px · z∈[-116,+112]mm · 7 of 176 slices shown (2 of 2)]
[im 1/176]
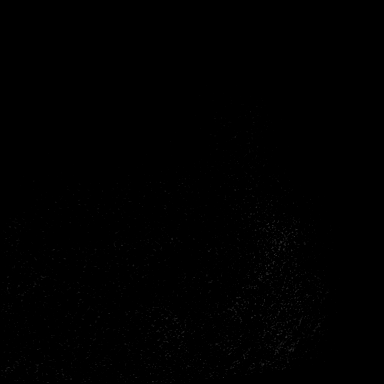
[im 30/176]
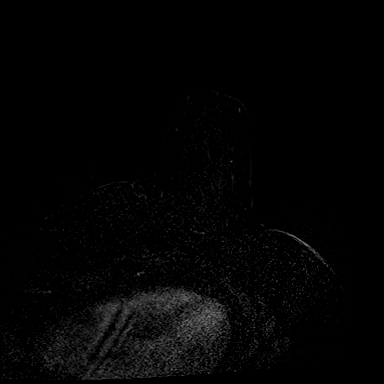
[im 59/176]
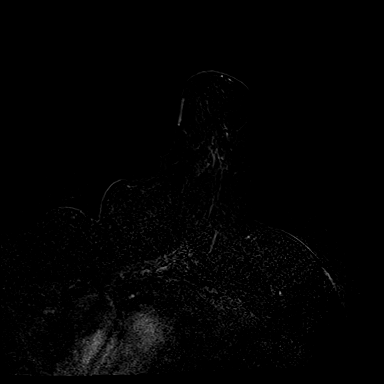
[im 88/176]
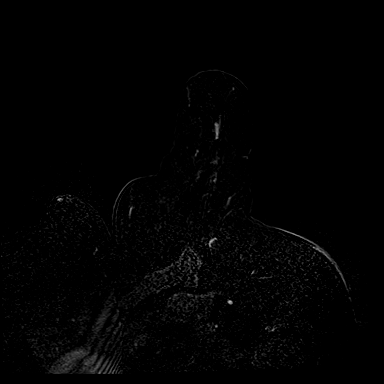
[im 117/176]
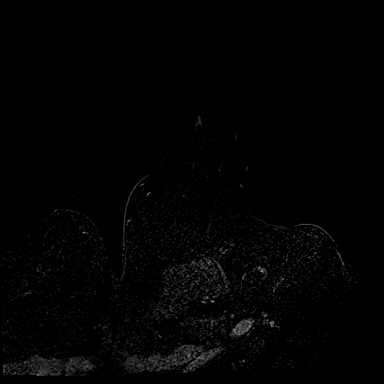
[im 146/176]
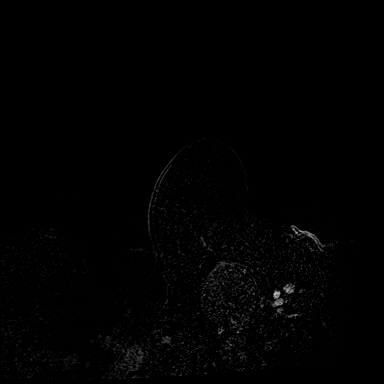
[im 176/176]
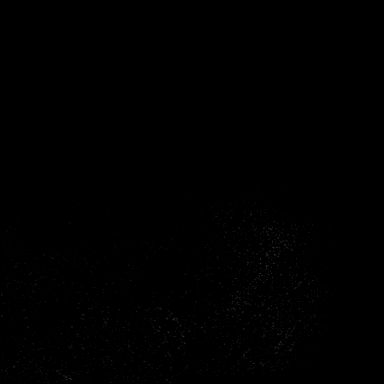

[Series 7: needle confirmation · axial · 1.3mm · 0.73mm/px · z∈[-116,+112]mm · 7 of 176 slices shown]
[im 1/176]
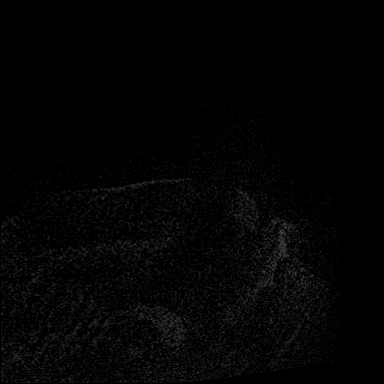
[im 30/176]
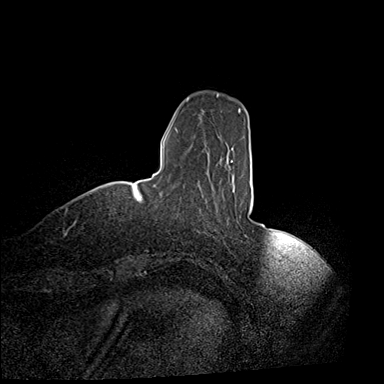
[im 59/176]
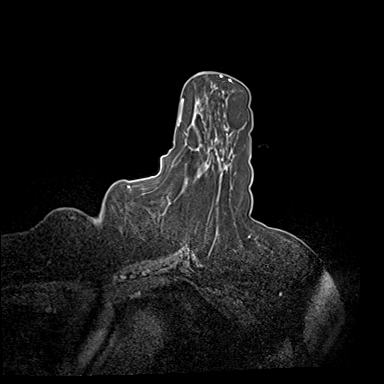
[im 88/176]
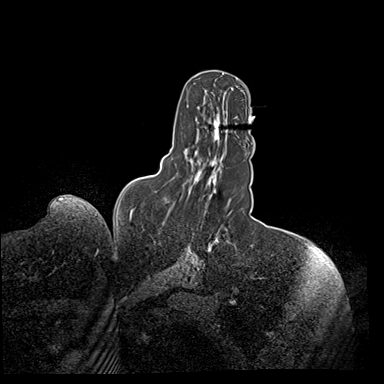
[im 117/176]
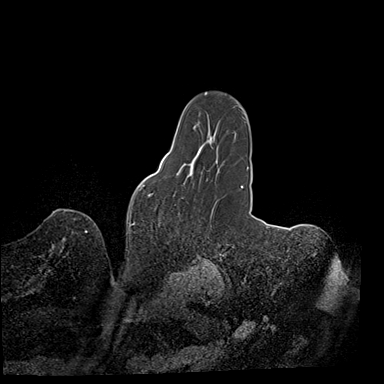
[im 146/176]
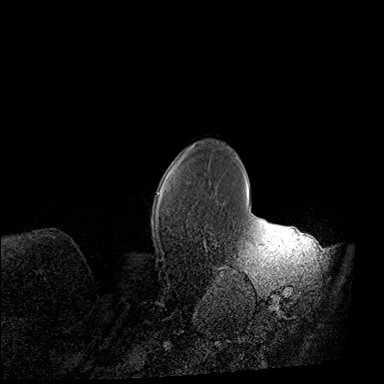
[im 176/176]
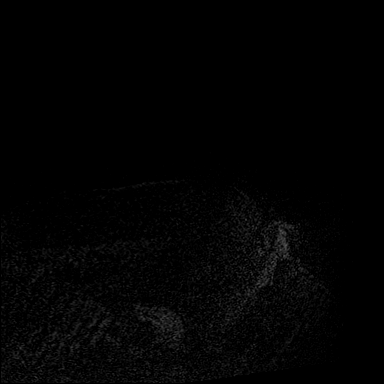

[Series 8: needle confirmation_sub · axial · 1.3mm · 0.73mm/px · z∈[-116,+35]mm · 5 of 176 slices shown]
[im 1/176]
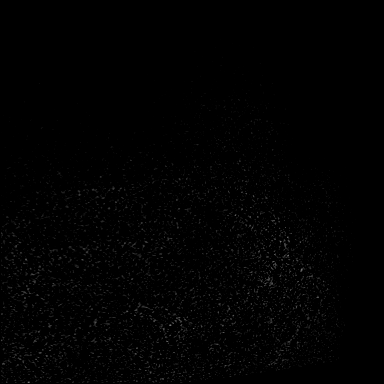
[im 30/176]
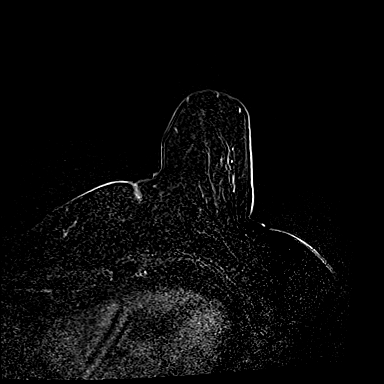
[im 59/176]
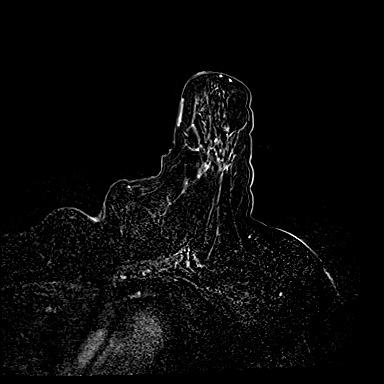
[im 88/176]
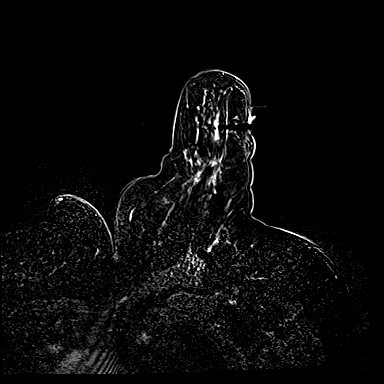
[im 117/176]
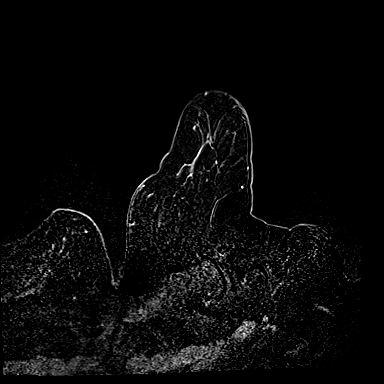

[32 of 48 positions shown; findings below may reference images not displayed]

FINDINGS: I met with the patient, and we discussed the procedure of MRI guided
biopsy, including risks, benefits, and alternatives. Specifically,
we discussed the risks of infection, bleeding, tissue injury, clip
migration, and inadequate sampling. Informed, written consent was
given. The usual time out protocol was performed immediately prior
to the procedure.

Using sterile technique, 1% Lidocaine, MRI guidance, and a 9 gauge
vacuum assisted device, biopsy was performed of the targeted 1.4 cm
linear non mass enhancement over the upper central left breast using
a lateral to medial approach. At the conclusion of the procedure, a
a cylinder shaped tissue marker clip was deployed into the biopsy
cavity. Follow-up 2-view mammogram was performed and dictated
separately.
IMPRESSION: MRI guided biopsy of linear non mass enhancement over the upper
central left breast. No apparent complications.

ADDENDUM:
Pathology revealed COMPLEX SCLEROSING LESION WITH ATYPIA of the LEFT
breast, mid [DATE], 12 o'clock, cylinder clip. This was found to be
concordant by Dr. Tai Benton, with surgical consultation for
consideration of excision recommended.

Pathology results were discussed with the patient by telephone. The
patient reported doing well after the biopsy with tenderness at the
site. Post biopsy instructions and care were reviewed and questions
were answered. The patient was encouraged to call The [REDACTED]

Surgical consultation has been arranged with Dr. Jean-Marie De La Riva at
[REDACTED[facility]HOSPITAL] on April 11, 2021.

Pathology results reported by Martins Rodrigues Cassao RN on 03/28/2021.

*** End of Addendum ***
FINDINGS: I met with the patient, and we discussed the procedure of MRI guided
biopsy, including risks, benefits, and alternatives. Specifically,
we discussed the risks of infection, bleeding, tissue injury, clip
migration, and inadequate sampling. Informed, written consent was
given. The usual time out protocol was performed immediately prior
to the procedure.

Using sterile technique, 1% Lidocaine, MRI guidance, and a 9 gauge
vacuum assisted device, biopsy was performed of the targeted 1.4 cm
linear non mass enhancement over the upper central left breast using
a lateral to medial approach. At the conclusion of the procedure, a
a cylinder shaped tissue marker clip was deployed into the biopsy
cavity. Follow-up 2-view mammogram was performed and dictated
separately.
IMPRESSION: MRI guided biopsy of linear non mass enhancement over the upper
central left breast. No apparent complications.

## 2023-11-09 ENCOUNTER — Other Ambulatory Visit: Payer: Self-pay | Admitting: Oncology

## 2023-11-09 DIAGNOSIS — Z170 Estrogen receptor positive status [ER+]: Secondary | ICD-10-CM

## 2023-11-14 ENCOUNTER — Other Ambulatory Visit: Payer: Self-pay

## 2023-11-14 DIAGNOSIS — Z170 Estrogen receptor positive status [ER+]: Secondary | ICD-10-CM

## 2023-11-14 MED ORDER — LETROZOLE 2.5 MG PO TABS
2.5000 mg | ORAL_TABLET | Freq: Every day | ORAL | 0 refills | Status: DC
Start: 2023-11-14 — End: 2024-02-10

## 2023-12-22 NOTE — Progress Notes (Unsigned)
 [facility]  [address] [place],  [place]  [address] [phone]  Clinic Day:  12/23/2023  Referring physician: Dorene Perkins, NP  HISTORY OF PRESENT ILLNESS:  The patient is a 63 y.o. 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  She completed her 4 cycles of adjuvant Taxotere /Cytoxan  in March 2023.  She also completed adjuvant breast radiation.  The patient is currently taking letrozole  on a daily basis for her adjuvant endocrine therapy.  She comes in today for routine follow-up.  Since her last visit, the patient has been doing well.  She denies having any changes with her breast/chest wall region which concern her for early disease recurrence.     PHYSICAL EXAM:  Blood pressure (!) 117/93, pulse (!) 111, temperature 98 F (36.7 C), temperature source Oral, resp. rate 16, height 5' 2 (1.575 m), weight 192 lb 14.4 oz (87.5 kg), SpO2 96%. Wt Readings from Last 3 Encounters:  12/23/23 192 lb 14.4 oz (87.5 kg)  06/24/23 193 lb 11.2 oz (87.9 kg)  02/21/23 193 lb 1.6 oz (87.6 kg)   Body mass index is 35.28 kg/m. Performance status (ECOG): 0 - Asymptomatic Physical Exam Constitutional:      Appearance: Normal appearance.  HENT:     Mouth/Throat:     Pharynx: Oropharynx is clear. No oropharyngeal exudate.   Cardiovascular:     Rate and Rhythm: Normal rate and regular rhythm.     Heart sounds: No murmur heard.    No friction rub. No gallop.  Pulmonary:     Breath sounds: Normal breath sounds.  Chest:  Breasts:    Right: Absent. No swelling, bleeding, inverted nipple, mass, nipple discharge or skin change.     Left: No swelling, bleeding, inverted nipple, mass, nipple discharge or skin change.  Abdominal:     General: Bowel sounds are normal. There is no distension.     Palpations: Abdomen is soft. There is no mass.     Tenderness: There is no abdominal tenderness.    Musculoskeletal:        General: No tenderness.     Cervical back: Normal range of motion and neck supple.     Right lower leg: No edema.     Left lower leg: No edema.  Lymphadenopathy:     Cervical: No cervical adenopathy.     Right cervical: No superficial, deep or posterior cervical adenopathy.    Left cervical: No superficial, deep or posterior cervical adenopathy.     Upper Body:     Right upper body: No supraclavicular or axillary adenopathy.     Left upper body: No supraclavicular or axillary adenopathy.     Lower Body: No right inguinal adenopathy. No left inguinal adenopathy.   Skin:    Coloration: Skin is not jaundiced.     Findings: No lesion or rash.   Neurological:     General: No focal deficit present.     Mental Status: She is alert and oriented to person, place, and time. Mental status is at baseline.   Psychiatric:        Mood and Affect: Mood normal.        Behavior: Behavior normal.        Thought Content: Thought content normal.        Judgment: Judgment normal.    ASSESSMENT & PLAN:  A 63 y.o. female with stage IA (T1c N1 M0)  hormone positive breast cancer, status post a right mastectomy in December 2022.  She completed her 4 cycles of adjuvant Taxotere /Cytoxan  in March 2023, which was followed by adjuvant chest wall radiation.  Based upon her clinical breast exam today, the patient remains disease-free.  She knows to continue taking her letrozole  on a daily basis to complete 5 total years of endocrine therapy.  She also knows to take her daily allotment of calcium and vitamin D for her bone health.  Clinically, the patient is doing well.  I will see her back in 6 months for her next clinical breast exam.  Her annual mammogram has already been scheduled before her next visit for her continued radiographic breast cancer surveillance.  The patient understands all the plans discussed today and is in agreement with them.    Kathleen Vary DELENA Kerns, MD

## 2023-12-23 ENCOUNTER — Inpatient Hospital Stay: Payer: Medicare Other | Attending: Oncology | Admitting: Oncology

## 2023-12-23 ENCOUNTER — Telehealth: Payer: Self-pay | Admitting: Oncology

## 2023-12-23 VITALS — BP 117/93 | HR 111 | Temp 98.0°F | Resp 16 | Ht 62.0 in | Wt 192.9 lb

## 2023-12-23 DIAGNOSIS — C50411 Malignant neoplasm of upper-outer quadrant of right female breast: Secondary | ICD-10-CM

## 2023-12-23 DIAGNOSIS — Z923 Personal history of irradiation: Secondary | ICD-10-CM | POA: Insufficient documentation

## 2023-12-23 DIAGNOSIS — Z9011 Acquired absence of right breast and nipple: Secondary | ICD-10-CM | POA: Insufficient documentation

## 2023-12-23 DIAGNOSIS — Z79811 Long term (current) use of aromatase inhibitors: Secondary | ICD-10-CM | POA: Insufficient documentation

## 2023-12-23 DIAGNOSIS — Z170 Estrogen receptor positive status [ER+]: Secondary | ICD-10-CM | POA: Insufficient documentation

## 2023-12-23 DIAGNOSIS — C50911 Malignant neoplasm of unspecified site of right female breast: Secondary | ICD-10-CM | POA: Diagnosis present

## 2023-12-23 NOTE — Telephone Encounter (Signed)
 Patient has been scheduled for follow-up visit per 12/23/23 LOS.  Pt given an appt calendar with date and time.

## 2024-02-08 ENCOUNTER — Other Ambulatory Visit: Payer: Self-pay | Admitting: Hematology and Oncology

## 2024-02-08 DIAGNOSIS — Z170 Estrogen receptor positive status [ER+]: Secondary | ICD-10-CM

## 2024-02-08 DIAGNOSIS — C50411 Malignant neoplasm of upper-outer quadrant of right female breast: Secondary | ICD-10-CM

## 2024-02-10 ENCOUNTER — Encounter: Payer: Self-pay | Admitting: Oncology

## 2024-03-15 NOTE — Progress Notes (Signed)
 [facility] Patient Name:  Kathleen Sherman (DOB: [birth year]) MRN:  [id number] Appt Date:  03/15/2024    CHIEF COMPLAINT   Sinus Problem (Sinus pressure, drainage, facial pain, cough, and congestion started yesterday. Pt has taken alka seltzer tablets.)   ASSESSMENT AND PLAN   Assessment/Plan Diagnoses and all orders for this visit: 1. Viral upper respiratory tract infection 2. Acute cough -     brompheniramine-pseudoePHEDrine-DM syrup; Take 5 mL by mouth three times a day as needed for allergies, cough or congestion for up to 10 days.  Dispense: 120 mL; Refill: 0  MDM Number of Diagnoses or Management Options Diagnosis management comments: Patient with a likely uncomplicated, viral upper respiratory tract infection. Patient is well-appearing and generally healthy patient. An unremarkable, nonfocal examination with no red flag features to suggest need for antibiotics, imaging, special testing or urgent specialty consultation at this time. Patient declines aptima viral testing. Reassurance is offered as to the normally self-limited nature of his condition with recommendations for rest, fluids and humidifier at night. Cough medication prescribed as needed. OTC tylenol  and ibuprofen as needed for fever/discomfort. Recommend f/u with PCP if symptoms persist. Patient is to go to the ER for any new, worsening or concerning symptoms. Patient agreeable with plan and comfortable going home.     HISTORY OF PRESENT ILLNESS   Subjective  Patient is a 62 year old female who presents to clinic today for complaints of nasal congestion, sinus pressure, postnasal drainage and dry cough and headache which began late Friday and worsened yesterday.  Patient reports her son and her went to the beach in both got sick following the return.  She reports she has been taking over-the-counter Alka-Seltzer cold medications with minimal relief.  No fevers, chills, ear pain, chest pain, shortness a  breath, wheezing, nausea, vomiting, weakness, dizziness, lightheadedness.  No other concerns.   Sinus Problem Associated symptoms include congestion, coughing and headaches. Pertinent negatives include no chest pain, chills, fever or weakness.   No Known Allergies  REVIEW OF SYSTEMS   Review of Systems  Constitutional:  Negative for chills and fever.  HENT:  Positive for congestion, postnasal drip and sinus pressure. Negative for ear pain.   Respiratory:  Positive for cough. Negative for shortness of breath, wheezing and stridor.   Cardiovascular:  Negative for chest pain.  Neurological:  Positive for headaches. Negative for dizziness, weakness and light-headedness.    PHYSICAL EXAMINATION   Objective Vitals:   03/15/24 1000  BP: 124/74  BP Location: Left arm  Patient Position: Sitting  Pulse: (!) 100  Resp: 20  Temp: 36.6 C (97.8 F)  TempSrc: Tympanic  SpO2: 95%  Weight: 196 lb (88.9 kg)  Height: 5' 2 (1.575 m)  PainSc:   5  PainLoc: Head   Physical Exam Vitals and nursing note reviewed.  Constitutional:      General: She is not in acute distress.    Appearance: Normal appearance. She is not ill-appearing, toxic-appearing or diaphoretic.  HENT:     Head: Normocephalic.     Right Ear: Tympanic membrane, ear canal and external ear normal.     Left Ear: Tympanic membrane, ear canal and external ear normal.     Nose: Congestion present.     Right Turbinates: Swollen.     Left Turbinates: Swollen.     Right Sinus: No maxillary sinus tenderness or frontal sinus tenderness.     Left Sinus: No maxillary sinus tenderness or frontal sinus tenderness.  Mouth/Throat:     Mouth: Mucous membranes are moist.     Pharynx: Oropharynx is clear. Uvula midline. Posterior oropharyngeal erythema and postnasal drip present. No pharyngeal swelling, oropharyngeal exudate or uvula swelling.  Eyes:     Extraocular Movements: Extraocular movements intact.     Conjunctiva/sclera:  Conjunctivae normal.  Cardiovascular:     Rate and Rhythm: Normal rate and regular rhythm.     Heart sounds: Normal heart sounds. No murmur heard. Pulmonary:     Effort: Pulmonary effort is normal. No respiratory distress.     Breath sounds: Normal breath sounds. No wheezing, rhonchi or rales.  Musculoskeletal:     Cervical back: Neck supple.  Lymphadenopathy:     Cervical: Cervical adenopathy present.  Skin:    General: Skin is warm and dry.  Neurological:     General: No focal deficit present.     Mental Status: She is alert.     Cranial Nerves: No cranial nerve deficit.  Psychiatric:        Mood and Affect: Mood normal.        Behavior: Behavior normal.        Ileana JONELLE Primmer, PA

## 2024-04-03 ENCOUNTER — Telehealth: Payer: Self-pay

## 2024-04-03 NOTE — Telephone Encounter (Signed)
 Pt called to report that she had labs done 2 weeks ago by PCP. Her WBC was low. She went to the lung Dr yesterday and was told she had spot on hr lung and thyroid . No SOB. Pt asking if she can be seen before scheduled Dec appt.

## 2024-06-23 ENCOUNTER — Inpatient Hospital Stay: Admitting: Oncology

## 2024-07-03 ENCOUNTER — Encounter: Payer: Self-pay | Admitting: Oncology

## 2024-07-10 ENCOUNTER — Inpatient Hospital Stay: Admitting: Oncology

## 2024-07-10 ENCOUNTER — Telehealth: Payer: Self-pay | Admitting: Oncology

## 2024-07-10 ENCOUNTER — Inpatient Hospital Stay: Attending: Oncology | Admitting: Oncology

## 2024-07-10 VITALS — BP 155/95 | HR 85 | Temp 97.6°F | Resp 16 | Ht 62.0 in | Wt 191.6 lb

## 2024-07-10 DIAGNOSIS — Z170 Estrogen receptor positive status [ER+]: Secondary | ICD-10-CM | POA: Diagnosis not present

## 2024-07-10 DIAGNOSIS — C50411 Malignant neoplasm of upper-outer quadrant of right female breast: Secondary | ICD-10-CM

## 2024-07-10 NOTE — Telephone Encounter (Signed)
 Patient has been scheduled for follow-up visit per 07/10/2024 LOS.  Pt given an appt calendar with date and time.

## 2024-07-10 NOTE — Progress Notes (Unsigned)
 " [facility]  [address] [place],  [place]  [address] [phone]  Clinic Day:  12/23/2023  Referring physician: Dorene Perkins, NP  HISTORY OF PRESENT ILLNESS:  The patient is a 64 y.o. 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  She completed her 4 cycles of adjuvant Taxotere /Cytoxan  in March 2023.  She also completed adjuvant breast radiation.  The patient is currently taking letrozole  on a daily basis for her adjuvant endocrine therapy.  She comes in today for routine follow-up.  Since her last visit, the patient has been doing well.  She denies having any changes with her breast/chest wall region which concern her for early disease recurrence.   Of note, her annual mammogram in October 2025 continued to show no evidence of disease recurrence.  PHYSICAL EXAM:  There were no vitals taken for this visit. Wt Readings from Last 3 Encounters:  12/23/23 192 lb 14.4 oz (87.5 kg)  06/24/23 193 lb 11.2 oz (87.9 kg)  02/21/23 193 lb 1.6 oz (87.6 kg)   There is no height or weight on file to calculate BMI. Performance status (ECOG): 0 - Asymptomatic Physical Exam Constitutional:      Appearance: Normal appearance.  HENT:     Mouth/Throat:     Pharynx: Oropharynx is clear. No oropharyngeal exudate.  Cardiovascular:     Rate and Rhythm: Normal rate and regular rhythm.     Heart sounds: No murmur heard.    No friction rub. No gallop.  Pulmonary:     Breath sounds: Normal breath sounds.  Chest:  Breasts:    Right: Absent. No swelling, bleeding, inverted nipple, mass, nipple discharge or skin change.     Left: No swelling, bleeding, inverted nipple, mass, nipple discharge or skin change.  Abdominal:     General: Bowel sounds are normal. There is no distension.     Palpations: Abdomen is soft. There is no mass.     Tenderness: There is no abdominal tenderness.  Musculoskeletal:         General: No tenderness.     Cervical back: Normal range of motion and neck supple.     Right lower leg: No edema.     Left lower leg: No edema.  Lymphadenopathy:     Cervical: No cervical adenopathy.     Right cervical: No superficial, deep or posterior cervical adenopathy.    Left cervical: No superficial, deep or posterior cervical adenopathy.     Upper Body:     Right upper body: No supraclavicular or axillary adenopathy.     Left upper body: No supraclavicular or axillary adenopathy.     Lower Body: No right inguinal adenopathy. No left inguinal adenopathy.  Skin:    Coloration: Skin is not jaundiced.     Findings: No lesion or rash.  Neurological:     General: No focal deficit present.     Mental Status: She is alert and oriented to person, place, and time. Mental status is at baseline.  Psychiatric:        Mood and Affect: Mood normal.        Behavior: Behavior normal.        Thought Content: Thought content normal.        Judgment: Judgment normal.    ASSESSMENT & PLAN:  A 64 y.o. 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  She completed her 4 cycles of adjuvant Taxotere /Cytoxan  in March 2023, which was followed by adjuvant chest wall radiation.  Based upon her clinical breast exam today, the patient remains disease-free.  She knows to continue taking her letrozole  on a daily basis to complete 5 total years of endocrine therapy.  She also knows to take her daily allotment of calcium and vitamin D for her bone health.  Clinically, the patient is doing well.  I will see her back in 6 months for her next clinical breast exam.  Her annual mammogram has already been scheduled before her next visit for her continued radiographic breast cancer surveillance.  The patient understands all the plans discussed today and is in agreement with them.    Jolanta Cabeza DELENA Kerns, MD       "

## 2024-07-12 ENCOUNTER — Encounter: Payer: Self-pay | Admitting: Oncology

## 2025-01-08 ENCOUNTER — Inpatient Hospital Stay: Admitting: Oncology
